# Patient Record
Sex: Female | Born: 1985 | Race: White | Hispanic: No | Marital: Married | Smoking: Former smoker
Health system: Southern US, Community
[De-identification: ages and names within clinical notes are randomized; demographics above are authoritative.]

## PROBLEM LIST (undated history)

## (undated) DIAGNOSIS — R519 Headache, unspecified: Secondary | ICD-10-CM

## (undated) DIAGNOSIS — E271 Primary adrenocortical insufficiency: Secondary | ICD-10-CM

## (undated) HISTORY — DX: Primary adrenocortical insufficiency: E27.1

## (undated) HISTORY — DX: Headache, unspecified: R51.9

---

## 2019-08-28 DIAGNOSIS — G35D Multiple sclerosis, unspecified: Secondary | ICD-10-CM

## 2019-08-28 DIAGNOSIS — E0590 Thyrotoxicosis, unspecified without thyrotoxic crisis or storm: Secondary | ICD-10-CM

## 2019-08-28 DIAGNOSIS — G35 Multiple sclerosis: Secondary | ICD-10-CM

## 2019-08-28 HISTORY — DX: Thyrotoxicosis, unspecified without thyrotoxic crisis or storm: E05.90

## 2019-08-28 HISTORY — DX: Multiple sclerosis: G35

## 2019-08-28 HISTORY — DX: Multiple sclerosis, unspecified: G35.D

## 2020-01-13 NOTE — Progress Notes (Signed)
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  History supplemented by hospital records and referring provider's note.  MRI of brain and cervical/thoracic spine personally reviewed.  She was diagnosed with multiple sclerosis in October 2020 after she presented with right optic neuritis.  She had been experiencing blurred vision in her right eye for about a week.  Work-up was performed at [facility].  MRI of brain with and without contrast showed extensive white matter lesions in a configuration consistent with MS as well as slight enlargement and mild enhancement of the right optic nerve.  MRI of cervical and thoracic spine showed no cord lesions. She underwent lumbar puncture where CSF showed elevated oligoclonal bands of 10 and IgG index 0.96. Systemic lupus profile was unremarkable.  She was treated with Solu-Medrol followed by a prednisone taper at discharge, but never followed up with outpatient neurology.  She has Addison's disease.  Since she was diagnosed with MS, she has also been diagnosed with Grave's disease.  LFTs were slightly elevated (t bili 1.3, AST/ALTs in the mid 100s), attributed to hyperthyroidism  Vision:  Still with slight blurred vision in right eye but improved.   Motor:  Feels generalized weakness.  Difficult to open jars. Sensory:  When she is hot in the sun, she has paresthesias in the arms and legs Pain:  No pain Gait:  No issues Bowel/Bladder:  More frequent bowel movements Fatigue:  Increased fatigue.  Followed up with endocrinology and was diagnosed with Graves Disease Cognition:  Sometimes with word-finding  issues. Mood:  anxiety If she gets up too fast, she feels briefly lightheaded Notes nausea.  Decreased appetite.  Occasional vomiting, possibly related to Graves disease Often feels out of breath. No Lhermitte's sign  Ophthalmologic exam from 01/08/2020, including optic nerve/discs, RNFL, and HVF, was unremarkable.  Imaging: 06/09/2019 MRI BRAIN W WO:  1. Extensive foci of T2 signal prolongation in the periventricular deep white matter and pericallosal fibers, likely representing multiple sclerosis.  2. Slight enlargement of the right optic nerves and mild enhancement, consistent with right optic neuritis, also likely a manifestation of multiple sclerosis.  3. No mass, hemorrhage or acute infarct.  4. Trace chronic anterior ethmoid sinus disease. 06/11/2019 MRI CERVICAL/THORACIC SPINE W WO:  1. No evidence of demyelinating lesion within the cervical or thoracic cord.  2. Mild cervical spondylosis at C5-6 and C6-7.  3. No significant degenerative change of the thoracic spine.  Labs: 06/09/2019 Serum:  RF negative, sed rate 7, ds-DNA antibody 1, anti-Smith negative, SSA/SSB antibodies negative, antichromatic antibodies negative, B12 328, vitamin D 28,  06/10/2019 CSF:  Protein 24, glucose 84, Oligoclonal bands 10 (not in serum), IgG index 0.96, Myelin basic protein 3.3, NMO antibody negative, ACE 1.1, cytology negative, gram stain & culture negative 01/05/2020 Serum:  CBC with WBC 4.6, HGB 13.5, HCT 39.9, PLT 243, ALC 1.40; CMP with Na 135, K 5.3, Cl 102, CO2 26, glucose 83, BUN 14, Cr 0.53, t bili 1.3, ALP 47, AST 157, ALT 158; TSH 0.04, free T4 4.68  No family history of MS.  Current DMT:  None Past DMT:  None  Medications:  Cortef, Florinef  PAST MEDICAL HISTORY: Past Medical History:  Diagnosis Date  . 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 Headache   . Hyperthyroidism 2021  . MS (multiple sclerosis) ([place]) 2021    PAST SURGICAL HISTORY: Past Surgical History:  Procedure  Laterality Date  . CESAREAN SECTION  2021   MEDICATIONS: Outpatient Encounter Medications as of 01/14/2020  Medication Sig  . acetaminophen (TYLENOL) 325 MG tablet Take 325 mg by mouth as needed.  Marland Kitchen atenolol (TENORMIN) 25 MG tablet Take 25 mg by mouth daily.  . fludrocortisone (FLORINEF) 0.1 MG tablet Take 0.1 mg by mouth daily.  . hydrocortisone (CORTEF) 10 MG tablet Take 10 mg by mouth in the morning and at bedtime.  . methimazole (TAPAZOLE) 10 MG tablet Take 10 mg by mouth 3 (three) times daily.   No facility-administered encounter medications on file as of 01/14/2020.    ALLERGIES: Allergies  Allergen Reactions  . Penicillins Rash    FAMILY HISTORY: Family History  Problem Relation Age of Onset  . Alzheimer's disease Paternal Grandfather     SOCIAL HISTORY: Social History   Socioeconomic History  . Marital status: Not on file    Spouse name: Not on file  . Number of children: Not on file  . Years of education: Not on file  . Highest education level: Not on file  Occupational History  . Not on file  Tobacco Use  . Smoking status: Not on file  Substance and Sexual Activity  . Alcohol use: Not on file  . Drug use: Not on file  . Sexual activity: Not on file  Other Topics Concern  . Not on file  Social History Narrative  . Not on file   Social Determinants of Health   Financial Resource Strain:   . Difficulty of Paying Living Expenses:   Food Insecurity:   . Worried About Charity fundraiser in the Last Year:   . Arboriculturist in the Last Year:   Transportation Needs:   . Film/video editor (Medical):   Marland Kitchen Lack of Transportation (Non-Medical):   Physical Activity:   . Days of Exercise per Week:   . Minutes of Exercise per Session:   Stress:   . Feeling of Stress :   Social Connections:   . Frequency of Communication with Friends and Family:   . Frequency of Social Gatherings with Friends and Family:   . Attends Religious Services:   . Active  Member of Clubs or Organizations:   . Attends Archivist Meetings:   Marland Kitchen Marital Status:   Intimate Partner Violence:   . Fear of Current or Ex-Partner:   . Emotionally Abused:   Marland Kitchen Physically Abused:   . Sexually Abused:     PHYSICAL EXAM: Blood pressure 95/65, pulse 89, height 5\' 4"  (1.626 m), weight 119 lb 9.6 oz (54.3 kg), SpO2 99 %. General: No acute distress.  Patient appears well-groomed.   Head:  Normocephalic/atraumatic Eyes:  fundi examined but not visualized Neck: supple, no paraspinal tenderness, full range of motion Back: No paraspinal tenderness Heart: regular rate and rhythm Lungs: Clear to auscultation bilaterally. Vascular: No carotid bruits. Neurological Exam: Mental status: alert and oriented to person, place, and time, recent and remote memory intact, fund of knowledge intact, attention and concentration intact, speech fluent and not dysarthric, language intact. Cranial nerves: CN I: not tested CN II: pupils equal, round and reactive to light, visual fields intact CN III, IV, VI:  full range of motion, no nystagmus, no ptosis CN V:  facial sensation intact CN VII: upper and lower face symmetric CN VIII: hearing intact CN IX, X: gag intact, uvula midline CN XI: sternocleidomastoid and trapezius muscles intact CN XII: tongue midline Bulk & Tone: normal, no fasciculations. Motor:  5/5 throughout  Sensation:  Pinprick and vibration sensation intact.  . Deep Tendon Reflexes:  2+ throughout, toes downgoing. Finger to nose testing:  Without dysmetria.  Heel to shin:  Without dysmetria.  Gait:  Normal station and stride.  Able to turn and tandem walk. Romberg negative.  IMPRESSION: Multiple sclerosis  PLAN: 1.  Plan to initiate Vumerity.  2.  Start D3 4000 IU daily 3.  Repeat new baseline MRI of brain with and without contrast 4.  Repeat CBC with diff, CMP and D in 6 months 5.  Follow up after repeat labs in 6 months  Thank you for allowing me to  take part in the care of this patient.  Shon Millet, DO  CC: Talmage Coin, MD

## 2020-01-14 ENCOUNTER — Other Ambulatory Visit: Payer: Self-pay

## 2020-01-14 ENCOUNTER — Encounter: Payer: Self-pay | Admitting: Neurology

## 2020-01-14 ENCOUNTER — Other Ambulatory Visit: Payer: Self-pay | Admitting: Neurology

## 2020-01-14 ENCOUNTER — Telehealth: Payer: Self-pay

## 2020-01-14 ENCOUNTER — Ambulatory Visit (INDEPENDENT_AMBULATORY_CARE_PROVIDER_SITE_OTHER): Payer: Commercial Managed Care - PPO | Admitting: Neurology

## 2020-01-14 VITALS — BP 95/65 | HR 89 | Ht 64.0 in | Wt 119.6 lb

## 2020-01-14 DIAGNOSIS — G35 Multiple sclerosis: Secondary | ICD-10-CM | POA: Diagnosis not present

## 2020-01-14 MED ORDER — DIAZEPAM 5 MG PO TABS
ORAL_TABLET | ORAL | 0 refills | Status: DC
Start: 2020-01-14 — End: 2020-11-10

## 2020-01-14 NOTE — Telephone Encounter (Signed)
Pt states she recived a call from [facility] about her MRI. She was asked if she has a fear of tight spaces and she answered yes.  Pt states she was told that she has to call Dr. Everlena Cooper to get something sent over to her pharmacy to help her stay calm during the procedure.  Pt wanted to know if that could be sent over to [facility].

## 2020-01-14 NOTE — Patient Instructions (Addendum)
1.  Start D3 4000 IU daily 2.  We will check a new MRI of brain with and without contrast to establish new baseline. We have sent a referral to [facility] for your MRI and they will call you directly to schedule your appointment. They are located at [address] [facility]. If you need to contact them directly please call [phone].  3.  We will start Vumerity (see below) 4.  Repeat CBC with diff, CMP and vitamin D level in 6 months.  5.  Follow up in 6 months (after repeat labs)  Diroximel fumarate delayed-release capsules What is this medicine? DIROXIMEL FUMARATE (dye ROX i mel FUE ma rate) helps to decrease the number of multiple sclerosis relapses in people with relapsing-remitting forms of the disease. It is not a cure. This medicine may be used for other purposes; ask your health care provider or pharmacist if you have questions. COMMON BRAND NAME(S): VUMERITY What should I tell my health care provider before I take this medicine? They need to know if you have any of these conditions:  immune system problems  infection  kidney disease  liver disease  low blood counts, like white cells  an unusual or allergic reaction to diroximel fumarate, dimethyl fumarate, other medicines, foods, dyes, or preservatives  pregnant or trying to get pregnant  breast-feeding How should I use this medicine? Take this medicine by mouth with a glass of water. Follow the directions on the prescription label. Do not cut, crush, or chew this medicine. Swallow the capsules whole. You can take it with or without food. If it upsets your stomach, take it with food. Do not drink alcohol at the time you take a dose of this medicine. Take your medicine at regular intervals. Do not take it more often than directed. Do not stop taking except on your doctor's advice. Talk to your pediatrician regarding the use of this medicine in children. Special care may be needed. Overdosage: If you think you have  taken too much of this medicine contact a poison control center or emergency room at once. NOTE: This medicine is only for you. Do not share this medicine with others. What if I miss a dose? If you miss a dose, take it as soon as you can. If it is almost time for your next dose, take only that dose. Do not take double or extra doses. What may interact with this medicine? Do not take this medicine with the following medications:  dimethyl fumarate  monomethyl fumarate This medicine may also interact with the following medications:  alcohol This list may not describe all possible interactions. Give your health care provider a list of all the medicines, herbs, non-prescription drugs, or dietary supplements you use. Also tell them if you smoke, drink alcohol, or use illegal drugs. Some items may interact with your medicine. What should I watch for while using this medicine? Visit your health care professional for regular checks on your progress. Tell your health care professional if your symptoms do not start to get better or if they get worse. You may need blood work done while you are taking this medicine. Taking your medicine with food (avoid high-fat, high-calorie meal or snack) may help reduce flushing. Call your doctor if the flushing from this medicine bothers you or does not go away. Ask your doctor if taking aspirin before taking this medicine may reduce flushing. This medicine may increase your risk of getting an infection. Call your health care professional for  advice if you get a fever, chills, or sore throat, or other symptoms of a cold or flu. Do not treat yourself. Try to avoid being around people who are sick. In some patients, this medicine may cause a serious brain infection that may cause death. If you have any problems seeing, thinking, speaking, walking, or standing, tell your healthcare professional right away. If you cannot reach your healthcare professional, urgently seek other  source of medical care. What side effects may I notice from receiving this medicine? Side effects that you should report to your doctor or health care professional as soon as possible:  allergic reactions like skin rash, itching or hives, swelling of the face, lips, or tongue  fever, chills, sore throat  signs and symptoms of liver injury like dark yellow or brown urine; general ill feeling or flu-like symptoms; light-colored stools; loss of appetite; nausea; right upper belly pain; unusually weak or tired; yellowing of the eyes or skin  signs and symptoms of a serious brain infection like changes in vision, confusion, weakness on one side of the body, loss of balance or coordination, loss of memory, or changes in personality Side effects that usually do not require medical attention (report to your doctor or health care professional if they continue or are bothersome):  diarrhea  facial flushing  nausea  stomach pain  upset stomach This list may not describe all possible side effects. Call your doctor for medical advice about side effects. You may report side effects to FDA at 1-800-FDA-1088. Where should I keep my medicine? Keep out of the reach of children. Store at room temperature between 20 and 25 degrees C (68 and 77 degrees F). Throw away any unused medicine after the expiration date. NOTE: This sheet is a summary. It may not cover all possible information. If you have questions about this medicine, talk to your doctor, pharmacist, or health care provider.  2020 Elsevier/Gold Standard (2019-03-06 09:81:19)

## 2020-01-14 NOTE — Telephone Encounter (Signed)
LMOVM

## 2020-01-14 NOTE — Telephone Encounter (Signed)
Sent a prescription for diazepam.  She is to take it one hour prior to MRI.  She will need a driver and should not expect to drive for at least 6 hours after taken.

## 2020-02-17 ENCOUNTER — Ambulatory Visit
Admission: RE | Admit: 2020-02-17 | Discharge: 2020-02-17 | Disposition: A | Discharge status: Home / Self Care | Payer: Commercial Managed Care - PPO | Source: Ambulatory Visit | Attending: Neurology | Admitting: Neurology

## 2020-02-17 ENCOUNTER — Other Ambulatory Visit: Payer: Self-pay

## 2020-02-17 DIAGNOSIS — G35 Multiple sclerosis: Secondary | ICD-10-CM

## 2020-02-17 MED ORDER — GADOBENATE DIMEGLUMINE 529 MG/ML IV SOLN
10.0000 mL | Freq: Once | INTRAVENOUS | Status: AC | PRN
  Administered 2020-02-17: 10 mL via INTRAVENOUS

## 2020-03-18 ENCOUNTER — Telehealth: Payer: Self-pay | Admitting: Neurology

## 2020-03-18 DIAGNOSIS — E0590 Thyrotoxicosis, unspecified without thyrotoxic crisis or storm: Secondary | ICD-10-CM | POA: Diagnosis not present

## 2020-03-18 DIAGNOSIS — E271 Primary adrenocortical insufficiency: Secondary | ICD-10-CM | POA: Diagnosis not present

## 2020-03-18 DIAGNOSIS — E0500 Thyrotoxicosis with diffuse goiter without thyrotoxic crisis or storm: Secondary | ICD-10-CM | POA: Diagnosis not present

## 2020-03-18 DIAGNOSIS — L819 Disorder of pigmentation, unspecified: Secondary | ICD-10-CM | POA: Diagnosis not present

## 2020-03-18 NOTE — Telephone Encounter (Signed)
Patient came in the office to check on the medicine Dr. Everlena Cooper was going to have her take at her last visit called Vumerity. She said it was never called into the pharmacy.

## 2020-03-21 NOTE — Telephone Encounter (Signed)
Telephone call to pt, Advised pt start form faxed off to [facility] 01/14/20. Will refax form off today.  Number to [employer] given to pt to check on the status of the form in a week.

## 2020-03-21 NOTE — Progress Notes (Addendum)
Start From refaxed to [employer]. First form faxed 5/50/21.

## 2020-04-01 NOTE — Progress Notes (Signed)
Rosealee Albee (Key: [id number])  Your information has been submitted to [facility]. [employer] [place] will review the request and notify you of the determination decision directly, typically within 72 hours of receiving all information.  You will also receive your request decision electronically. To check for an update later, open this request again from your dashboard.  If [employer] has not responded within the specified timeframe or if you have any questions about your PA submission, contact [employer] [place] directly at [phone].

## 2020-04-05 ENCOUNTER — Encounter: Payer: Self-pay | Admitting: Neurology

## 2020-04-05 NOTE — Progress Notes (Addendum)
Got all the information together for appeal to [facility]. Waiting on patient to come sign form of consent for them so I can fax all info over for appeal.   [facility] 04/05/20 for patient to come sign; left form with front for her to sign and give paperwork back to me.   [facility] 04/07/20- same as above.

## 2020-04-06 DIAGNOSIS — Z20828 Contact with and (suspected) exposure to other viral communicable diseases: Secondary | ICD-10-CM | POA: Diagnosis not present

## 2020-04-06 DIAGNOSIS — J3489 Other specified disorders of nose and nasal sinuses: Secondary | ICD-10-CM | POA: Diagnosis not present

## 2020-04-12 ENCOUNTER — Telehealth: Payer: Self-pay | Admitting: Neurology

## 2020-04-12 NOTE — Telephone Encounter (Signed)
Called patient to let her know she needs to sign consent form with insurance for Vumerity Appeal. She said that she actually wanted to speak with Dr. Everlena Cooper about this. She said that she was thinking she doesn't need to take the medication. She found out that she was suffering from high thyroid levels which was causing her to have more issues. Now that she has her thyroid under control she feels great. She feels as though she doesn't need to go through with Vumerity medication as there is a lot that she can/can't do with that medication. Please follow up with patient and let me know about if I need to continue with appeal. Thanks!

## 2020-04-12 NOTE — Telephone Encounter (Signed)
I strongly recommend taking an MS modifying medication.  Even though she feels okay now, she is at risk for future MS flare ups and progression of disease that may cause disability.  She is young and neurologically, she is doing okay.  I want her to remain this way.  Definitely would like her to follow up to discuss further.

## 2020-04-13 NOTE — Telephone Encounter (Signed)
LMOVM for pt to call us back.

## 2020-04-18 DIAGNOSIS — E271 Primary adrenocortical insufficiency: Secondary | ICD-10-CM | POA: Diagnosis not present

## 2020-04-18 DIAGNOSIS — E0500 Thyrotoxicosis with diffuse goiter without thyrotoxic crisis or storm: Secondary | ICD-10-CM | POA: Diagnosis not present

## 2020-04-18 DIAGNOSIS — E0590 Thyrotoxicosis, unspecified without thyrotoxic crisis or storm: Secondary | ICD-10-CM | POA: Diagnosis not present

## 2020-04-18 DIAGNOSIS — L819 Disorder of pigmentation, unspecified: Secondary | ICD-10-CM | POA: Diagnosis not present

## 2020-04-21 DIAGNOSIS — D224 Melanocytic nevi of scalp and neck: Secondary | ICD-10-CM | POA: Diagnosis not present

## 2020-04-21 DIAGNOSIS — L301 Dyshidrosis [pompholyx]: Secondary | ICD-10-CM | POA: Diagnosis not present

## 2020-04-21 DIAGNOSIS — D225 Melanocytic nevi of trunk: Secondary | ICD-10-CM | POA: Diagnosis not present

## 2020-05-10 DIAGNOSIS — E0590 Thyrotoxicosis, unspecified without thyrotoxic crisis or storm: Secondary | ICD-10-CM | POA: Diagnosis not present

## 2020-05-10 DIAGNOSIS — E040 Nontoxic diffuse goiter: Secondary | ICD-10-CM | POA: Diagnosis not present

## 2020-05-10 DIAGNOSIS — R0989 Other specified symptoms and signs involving the circulatory and respiratory systems: Secondary | ICD-10-CM | POA: Diagnosis not present

## 2020-05-10 DIAGNOSIS — R490 Dysphonia: Secondary | ICD-10-CM | POA: Diagnosis not present

## 2020-05-10 DIAGNOSIS — Z8349 Family history of other endocrine, nutritional and metabolic diseases: Secondary | ICD-10-CM | POA: Diagnosis not present

## 2020-05-12 NOTE — Progress Notes (Deleted)
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  History supplemented by hospital records and referring provider's note.  MRI of brain and cervical/thoracic spine personally reviewed.  UPDATE: Current DMT:  none Other medications:  D3 4000 IU daily  MRI of brain with and without contrast on 02/17/2020 personally reviewed and showed more than 20 non-enhancing T2/FLAIR lesions within the periventricular and juxtacortical white matter bilaterally with no infratentorial lesions, reportedly unchanged compared with prior imaging from Oct 2020.  She reports feeling well and ***  Vision:  Still with slight blurred vision in right eye but improved.   Motor:  Feels generalized weakness.  Difficult to open jars. Sensory:  When she is hot in the sun, she has paresthesias in the arms and legs Pain:  No pain Gait:  No issues Bowel/Bladder:  More frequent bowel movements Fatigue:  Increased fatigue.  Followed up with endocrinology and was diagnosed with Graves Disease Cognition:  Sometimes with word-finding issues. Mood:  anxiety If she gets up too fast, she feels briefly lightheaded Notes nausea.  Decreased appetite.  Occasional vomiting, possibly related to Graves disease Often feels out of breath. No Lhermitte's sign  HISTORY: She was diagnosed with multiple sclerosis in October 2020 after she presented with right optic neuritis.  She had been experiencing blurred vision in her right eye for about a week.  Work-up was performed at [facility].  MRI of brain with and without contrast showed extensive white matter lesions in a configuration consistent with MS as well as slight enlargement and mild enhancement of the right optic nerve.  MRI of cervical and thoracic spine showed no cord  lesions. She underwent lumbar puncture where CSF showed elevated oligoclonal bands of 10 and IgG index 0.96. Systemic lupus profile was unremarkable.  She was treated with Solu-Medrol followed by a prednisone taper at discharge, but never followed up with outpatient neurology.  She has Addison's disease.  Since she was diagnosed with MS, she has also been diagnosed with Grave's disease.  LFTs were slightly elevated (t bili 1.3, AST/ALTs in the mid 100s), attributed to hyperthyroidism   Ophthalmologic exam from 01/08/2020, including optic nerve/discs, RNFL, and HVF, was unremarkable.  Imaging: 06/09/2019 MRI BRAIN W WO:  1. Extensive foci of T2 signal prolongation in the periventricular deep white matter and pericallosal fibers, likely representing multiple sclerosis.  2. Slight enlargement of the right optic nerves and mild enhancement, consistent with right optic neuritis, also likely a manifestation of multiple sclerosis.  3. No mass, hemorrhage or acute infarct.  4. Trace chronic anterior ethmoid sinus disease. 06/11/2019 MRI CERVICAL/THORACIC SPINE W WO:  1. No evidence of demyelinating lesion within the cervical or thoracic cord.  2. Mild cervical spondylosis at C5-6 and C6-7.  3. No significant degenerative change of the thoracic spine.  Labs: 06/09/2019 Serum:  RF negative, sed rate 7, ds-DNA antibody 1, anti-Smith negative, SSA/SSB antibodies negative, antichromatic antibodies negative, B12 328, vitamin D 28,  06/10/2019 CSF:  Protein 24, glucose 84, Oligoclonal bands 10 (not in serum), IgG index 0.96, Myelin basic protein 3.3, NMO antibody negative, ACE 1.1, cytology negative, gram stain & culture negative 01/05/2020 Serum:  CBC with WBC 4.6, HGB 13.5, HCT 39.9, PLT 243, ALC 1.40; CMP with Na 135, K 5.3, Cl 102, CO2 26, glucose 83, BUN 14,  Cr 0.53, t bili 1.3, ALP 47, AST 157, ALT 158; TSH 0.04, free T4 4.68  No family history of MS.  Current DMT:  None Past DMT:   None  Medications:  Cortef, Florinef  PAST MEDICAL HISTORY: Past Medical History:  Diagnosis Date  . Autoimmune Addison's disease (HCC)   . Headache   . Hyperthyroidism 2021  . MS (multiple sclerosis) (HCC) 2021    MEDICATIONS: Current Outpatient Medications on File Prior to Visit  Medication Sig Dispense Refill  . acetaminophen (TYLENOL) 325 MG tablet Take 325 mg by mouth as needed.    Marland Kitchen atenolol (TENORMIN) 25 MG tablet Take 25 mg by mouth daily.    . diazepam (VALIUM) 5 MG tablet Take 1 tablet one hour prior to MRI. 1 tablet 0  . fludrocortisone (FLORINEF) 0.1 MG tablet Take 0.1 mg by mouth daily.    . hydrocortisone (CORTEF) 10 MG tablet Take 10 mg by mouth in the morning and at bedtime.    . methimazole (TAPAZOLE) 10 MG tablet Take 10 mg by mouth 3 (three) times daily.     No current facility-administered medications on file prior to visit.    ALLERGIES: Allergies  Allergen Reactions  . Penicillins Rash    FAMILY HISTORY: Family History  Problem Relation Age of Onset  . Alzheimer's disease Paternal Grandfather     SOCIAL HISTORY: Social History   Socioeconomic History  . Marital status: Married    Spouse name: Not on file  . Number of children: Not on file  . Years of education: Not on file  . Highest education level: Not on file  Occupational History  . Not on file  Tobacco Use  . Smoking status: Former Smoker    Quit date: 06/16/2019    Years since quitting: 0.9  . Smokeless tobacco: Never Used  Vaping Use  . Vaping Use: Never used  Substance and Sexual Activity  . Alcohol use: Not Currently  . Drug use: Not Currently  . Sexual activity: Not on file  Other Topics Concern  . Not on file  Social History Narrative   Right hnaded   Lives with husband and child in one story home   Social Determinants of Health   Financial Resource Strain:   . Difficulty of Paying Living Expenses: Not on file  Food Insecurity:   . Worried About Brewing technologist in the Last Year: Not on file  . Ran Out of Food in the Last Year: Not on file  Transportation Needs:   . Lack of Transportation (Medical): Not on file  . Lack of Transportation (Non-Medical): Not on file  Physical Activity:   . Days of Exercise per Week: Not on file  . Minutes of Exercise per Session: Not on file  Stress:   . Feeling of Stress : Not on file  Social Connections:   . Frequency of Communication with Friends and Family: Not on file  . Frequency of Social Gatherings with Friends and Family: Not on file  . Attends Religious Services: Not on file  . Active Member of Clubs or Organizations: Not on file  . Attends Banker Meetings: Not on file  . Marital Status: Not on file  Intimate Partner Violence:   . Fear of Current or Ex-Partner: Not on file  . Emotionally Abused: Not on file  . Physically Abused: Not on file  . Sexually Abused: Not on file    PHYSICAL EXAM: *** General: No acute distress.  Patient appears well-groomed.   Head:  Normocephalic/atraumatic Eyes:  Fundi examined but not visualized Neck: supple, no paraspinal tenderness, full range of motion Heart:  Regular rate and rhythm Lungs:  Clear to auscultation bilaterally Back: No paraspinal tenderness Neurological Exam: alert and oriented to person, place, and time. Attention span and concentration intact, recent and remote memory intact, fund of knowledge intact.  Speech fluent and not dysarthric, language intact.  CN II-XII intact. Bulk and tone normal, muscle strength 5/5 throughout.  Sensation to pinprick and vibration intact.  Deep tendon reflexes 2+ throughout, toes downgoing.  Finger to nose and heel to shin testing intact.  Gait normal, Romberg negative.  IMPRESSION: Relapsing-remitting multiple sclerosis  PLAN: ***  Shon Millet, DO  CC: ***

## 2020-05-13 ENCOUNTER — Ambulatory Visit: Payer: Commercial Managed Care - PPO | Admitting: Neurology

## 2020-05-16 NOTE — Progress Notes (Signed)
Virtual Visit via Video Note The purpose of this virtual visit is to provide medical care while limiting exposure to the novel coronavirus.    Consent was obtained for video visit:  Yes.   Answered questions that patient had about telehealth interaction:  Yes.   I discussed the limitations, risks, security and privacy concerns of performing an evaluation and management service by telemedicine. I also discussed with the patient that there may be a patient responsible charge related to this service. The patient expressed understanding and agreed to proceed.  Pt location: Home Physician Location: office Name of referring provider:  No ref. provider found I connected with Rosealee Albee at patients initiation/request on 05/18/2020 at 10:30 AM EDT by video enabled telemedicine application and verified that I am speaking with the correct person using two identifiers. 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  UPDATE: Current DMT:  none Other medications:  D3 4000 IU daily   MRI of brain with and without contrast on 02/17/2020 personally reviewed and showed more than 20 non-enhancing T2/FLAIR lesions within the periventricular and juxtacortical white matter bilaterally with no infratentorial lesions, reportedly unchanged compared with prior imaging from Oct 2020.  She was supposed to start Vumerity.  However, since starting treatment for Graves' Disease, she has been feeling well, therefore she decided to hold off on the DMT  Vision:  Very mild blurred vision with distance in the right eye Motor:  Normal Sensory:  No issues. Pain:  No pain Gait:  No issues Bowel/Bladder:  No issues. Fatigue:  no Cognition:  Sometimes with word-finding issues. Mood:  anxiety No Lhermitte's  sign  HISTORY: She was diagnosed with multiple sclerosis in October 2020 after she presented with right optic neuritis.  She had been experiencing blurred vision in her right eye for about a week.  Work-up was performed at [facility].  MRI of brain with and without contrast showed extensive white matter lesions in a configuration consistent with MS as well as slight enlargement and mild enhancement of the right optic nerve.  MRI of cervical and thoracic spine showed no cord lesions. She underwent lumbar puncture where CSF showed elevated oligoclonal bands of 10 and IgG index 0.96. Systemic lupus profile was unremarkable.  She was treated with Solu-Medrol followed by a prednisone taper at discharge, but never followed up with outpatient neurology.  She has Addison's disease.  Since she was diagnosed with MS, she has also been diagnosed with Grave's disease.  LFTs were slightly elevated (t bili 1.3, AST/ALTs in the mid 100s), attributed to hyperthyroidism   Ophthalmologic exam from 01/08/2020, including optic nerve/discs, RNFL, and HVF, was unremarkable.  Imaging: 06/09/2019 MRI BRAIN W WO:  1. Extensive foci of T2 signal prolongation in the periventricular deep white matter and pericallosal fibers, likely representing multiple sclerosis.  2. Slight enlargement of the right optic nerves and mild enhancement, consistent with right optic neuritis, also likely a manifestation of multiple sclerosis.  3. No mass, hemorrhage or acute infarct.  4. Trace chronic anterior ethmoid sinus disease. 06/11/2019 MRI CERVICAL/THORACIC SPINE W WO:  1. No evidence of demyelinating lesion within the cervical or thoracic cord.  2. Mild cervical spondylosis at C5-6 and C6-7.  3. No significant degenerative change of the thoracic spine.  Labs: 06/09/2019 Serum:  RF negative, sed rate 7, ds-DNA antibody 1, anti-Smith negative, SSA/SSB antibodies negative, antichromatic antibodies negative, B12  328, vitamin D 28,  06/10/2019 CSF:  Protein 24, glucose 84, Oligoclonal bands 10 (not in serum), IgG index 0.96, Myelin basic protein 3.3, NMO antibody negative, ACE 1.1, cytology negative, gram stain & culture negative 01/05/2020 Serum:  CBC with WBC 4.6, HGB 13.5, HCT 39.9, PLT 243, ALC 1.40; CMP with Na 135, K 5.3, Cl 102, CO2 26, glucose 83, BUN 14, Cr 0.53, t bili 1.3, ALP 47, AST 157, ALT 158; TSH 0.04, free T4 4.68  No family history of MS.  Past DMT:  None  Medications:  Cortef, Florinef  Past Medical History: Past Medical History:  Diagnosis Date  . Autoimmune Addison's disease (HCC)   . Headache   . Hyperthyroidism 2021  . MS (multiple sclerosis) (HCC) 2021    Medications: Outpatient Encounter Medications as of 05/18/2020  Medication Sig  . acetaminophen (TYLENOL) 325 MG tablet Take 325 mg by mouth as needed.  Marland Kitchen atenolol (TENORMIN) 25 MG tablet Take 25 mg by mouth daily.  . diazepam (VALIUM) 5 MG tablet Take 1 tablet one hour prior to MRI.  . fludrocortisone (FLORINEF) 0.1 MG tablet Take 0.1 mg by mouth daily.  . hydrocortisone (CORTEF) 10 MG tablet Take 10 mg by mouth in the morning and at bedtime.  . methimazole (TAPAZOLE) 10 MG tablet Take 10 mg by mouth 3 (three) times daily.   No facility-administered encounter medications on file as of 05/18/2020.    Allergies: Allergies  Allergen Reactions  . Penicillins Rash    Family History: Family History  Problem Relation Age of Onset  . Alzheimer's disease Paternal Grandfather     Social History: Social History   Socioeconomic History  . Marital status: Married    Spouse name: Not on file  . Number of children: Not on file  . Years of education: Not on file  . Highest education level: Not on file  Occupational History  . Not on file  Tobacco Use  . Smoking status: Former Smoker    Quit date: 06/16/2019    Years since quitting: 0.9  . Smokeless tobacco: Never Used  Vaping Use  . Vaping Use: Never  used  Substance and Sexual Activity  . Alcohol use: Not Currently  . Drug use: Not Currently  . Sexual activity: Not on file  Other Topics Concern  . Not on file  Social History Narrative   Right hnaded   Lives with husband and child in one story home   Social Determinants of Health   Financial Resource Strain:   . Difficulty of Paying Living Expenses: Not on file  Food Insecurity:   . Worried About Programme researcher, broadcasting/film/video in the Last Year: Not on file  . Ran Out of Food in the Last Year: Not on file  Transportation Needs:   . Lack of Transportation (Medical): Not on file  . Lack of Transportation (Non-Medical): Not on file  Physical Activity:   . Days of Exercise per Week: Not on file  . Minutes of Exercise per Session: Not on file  Stress:   . Feeling of Stress : Not on file  Social Connections:   . Frequency of Communication with Friends and Family: Not on file  . Frequency of Social Gatherings with Friends and Family: Not on file  . Attends Religious Services: Not on file  . Active Member of Clubs or Organizations:  Not on file  . Attends Banker Meetings: Not on file  . Marital Status: Not on file  Intimate Partner Violence:   . Fear of Current or Ex-Partner: Not on file  . Emotionally Abused: Not on file  . Physically Abused: Not on file  . Sexually Abused: Not on file    Observations/Objective:   Height 5\' 4"  (1.626 m), weight 130 lb (59 kg). No acute distress.  Alert and oriented.  Speech fluent and not dysarthric.  Language intact.    Assessment and Plan:   Relapsing-remitting multiple sclerosis.  I explained the importance of taking DMT to prevent MS flares and progression of disease.  She is agreeable to starting. Graves Disease  1. Vumerity 2.  D3 4000 IU daily 3.  CBC with diff, CMP and vitamin D level in 6 months 4.  Follow up after repeat labs.  Follow Up Instructions:    -I discussed the assessment and treatment plan with the patient. The  patient was provided an opportunity to ask questions and all were answered. The patient agreed with the plan and demonstrated an understanding of the instructions.   The patient was advised to call back or seek an in-person evaluation if the symptoms worsen or if the condition fails to improve as anticipated.    , DO

## 2020-05-18 ENCOUNTER — Telehealth (INDEPENDENT_AMBULATORY_CARE_PROVIDER_SITE_OTHER): Payer: BC Managed Care – PPO | Admitting: Neurology

## 2020-05-18 ENCOUNTER — Encounter: Payer: Self-pay | Admitting: Neurology

## 2020-05-18 ENCOUNTER — Other Ambulatory Visit: Payer: Self-pay

## 2020-05-18 VITALS — Ht 64.0 in | Wt 130.0 lb

## 2020-05-18 DIAGNOSIS — G35 Multiple sclerosis: Secondary | ICD-10-CM | POA: Diagnosis not present

## 2020-05-18 DIAGNOSIS — E0500 Thyrotoxicosis with diffuse goiter without thyrotoxic crisis or storm: Secondary | ICD-10-CM

## 2020-05-18 NOTE — Patient Instructions (Addendum)
1.  Proceed with Vumerity 2.  Continue D3 4000 IU daily 3.  Repeat CBC with diff, CMP and vitamin D in 6 months 4.  Follow up in 6 months after labs rechecked

## 2020-05-18 NOTE — Addendum Note (Signed)
Addended by: Leida Lauth on: 05/18/2020 11:23 AM   Modules accepted: Orders

## 2020-05-24 ENCOUNTER — Telehealth: Payer: Self-pay | Admitting: Neurology

## 2020-05-24 NOTE — Telephone Encounter (Signed)
Possibly.  Does she want to give the medication a little more time or does she want to change to something else?

## 2020-05-24 NOTE — Telephone Encounter (Signed)
Pt advised that the symptoms could be a side effect. Pt states she will give another couple of weeks and see how she do and give [place] a update.

## 2020-05-24 NOTE — Telephone Encounter (Signed)
Pt waited til the Monday to start her medication around lunch time. Around the mid after noon, Pt noticed her skin was dry. Her whole body feels dry and itchy. No notice of any hives or rash.   Pt wanted to know if this could be a side effect of the medication?

## 2020-05-24 NOTE — Telephone Encounter (Signed)
Patient called with questions about Vumerity that she just started yesterday, 05/23/20. She said, "I'm having some tingling and itching and feel like I'm going to break out in hives."

## 2020-06-08 NOTE — Progress Notes (Addendum)
05/24/20- mailed consent form that ins needs patient to sign and return. Called patient to let her know I was sending.   10/13- called patient and left msg to see if she received consent form and if she was able to return it back to [place] so we could send in appeal.   Patient returned call that she would send it back in the mail today.   10/26- received consent form in mail and faxed to appeals department.   10/29- received appeal denial letter- not medically necessary as denial reason. Gave to [facility] to see what to do next.

## 2020-06-29 ENCOUNTER — Telehealth: Payer: Self-pay | Admitting: Neurology

## 2020-06-29 NOTE — Telephone Encounter (Signed)
Patient called in and spoke with [place]. They are sending her a new quick start kit for Vumerity and they are going to have to appeal again. She wants to make sure we got her signed appeal and see if anything else needs to be done.

## 2020-06-30 NOTE — Telephone Encounter (Signed)
I called patient and told her I was in contact with Crystal at [employer] and just faxed over all her insurance denial letter this morning so she has all that info for the patient assistance.   She said that since insurance was denying it and the price without assistance was 9,000$ a month. She wanted to know if it was worth doing the financial assistance and staying on medication since she has been on it a month already or if she needed to switch to a different medication. She didn't know if for her condition if there were any other medications that were better than this one since she was having to go through assistance to get this medication.   She said to call her back and if she didn't answer it was okay to leave info on her voicemail.

## 2020-06-30 NOTE — Telephone Encounter (Signed)
Can you see on this? thanks

## 2020-06-30 NOTE — Telephone Encounter (Signed)
Called patient to give her update. She will stay on the medication and continue with assistance program.

## 2020-06-30 NOTE — Telephone Encounter (Signed)
fyi

## 2020-06-30 NOTE — Telephone Encounter (Signed)
Since she has already started Vumerity, I would continue it and use the assistance program.  It is a good medication.

## 2020-07-07 ENCOUNTER — Telehealth: Payer: Self-pay | Admitting: Neurology

## 2020-07-07 NOTE — Telephone Encounter (Signed)
We can change to a different medication.  I would consider Ponvory, Kesimpta or Zeposia

## 2020-07-07 NOTE — Telephone Encounter (Signed)
Pt states she thinks the itching and burning and red spots may come from the Vumerity. Pt stats she the medication at the same time. Besides in September this is the only time it happened.  Please advise

## 2020-07-07 NOTE — Telephone Encounter (Signed)
Patient called and said, "Yesterday evening I had an episode of what looked like hives. It started on my arms and they were tingling and itching. This then very quickly spread to my neck, face with red patches, then to my whole body. I was on the phone with a nurse educator but was told they could not give my any advice on what to take for it. I took some benadryl and that cleared it up. I'd like to speak with Dr. Everlena Cooper or his nurse."  [facility] in [place], [place]

## 2020-07-08 NOTE — Telephone Encounter (Signed)
Telephone call to the pt, Pt states every time she takes the Vumerity her second dose she has side effects of Rash and itching burning.    Pt states she has stop taking the medication as of last night. What else could she take for her MS?

## 2020-07-08 NOTE — Telephone Encounter (Signed)
Pt advised of the different medication recommended by DR. Jaffe.    Pt will research and I will put pt information in the mail for her as well. Pt to call back on Monday to let us know which one she will like to start.

## 2020-07-18 ENCOUNTER — Ambulatory Visit: Payer: Commercial Managed Care - PPO | Admitting: Neurology

## 2020-08-05 DIAGNOSIS — E271 Primary adrenocortical insufficiency: Secondary | ICD-10-CM | POA: Diagnosis not present

## 2020-08-05 DIAGNOSIS — L819 Disorder of pigmentation, unspecified: Secondary | ICD-10-CM | POA: Diagnosis not present

## 2020-08-05 DIAGNOSIS — E0590 Thyrotoxicosis, unspecified without thyrotoxic crisis or storm: Secondary | ICD-10-CM | POA: Diagnosis not present

## 2020-08-05 DIAGNOSIS — E0500 Thyrotoxicosis with diffuse goiter without thyrotoxic crisis or storm: Secondary | ICD-10-CM | POA: Diagnosis not present

## 2020-09-07 DIAGNOSIS — E0590 Thyrotoxicosis, unspecified without thyrotoxic crisis or storm: Secondary | ICD-10-CM | POA: Diagnosis not present

## 2020-09-22 DIAGNOSIS — E271 Primary adrenocortical insufficiency: Secondary | ICD-10-CM | POA: Diagnosis not present

## 2020-09-22 DIAGNOSIS — Z20822 Contact with and (suspected) exposure to covid-19: Secondary | ICD-10-CM | POA: Diagnosis not present

## 2020-09-22 DIAGNOSIS — J029 Acute pharyngitis, unspecified: Secondary | ICD-10-CM | POA: Diagnosis not present

## 2020-09-22 DIAGNOSIS — Z7189 Other specified counseling: Secondary | ICD-10-CM | POA: Diagnosis not present

## 2020-10-05 DIAGNOSIS — G35 Multiple sclerosis: Secondary | ICD-10-CM | POA: Diagnosis not present

## 2020-10-05 DIAGNOSIS — E271 Primary adrenocortical insufficiency: Secondary | ICD-10-CM | POA: Diagnosis not present

## 2020-10-05 DIAGNOSIS — M79671 Pain in right foot: Secondary | ICD-10-CM | POA: Diagnosis not present

## 2020-10-05 DIAGNOSIS — S99921A Unspecified injury of right foot, initial encounter: Secondary | ICD-10-CM | POA: Diagnosis not present

## 2020-10-05 DIAGNOSIS — Z87891 Personal history of nicotine dependence: Secondary | ICD-10-CM | POA: Diagnosis not present

## 2020-10-05 DIAGNOSIS — W228XXA Striking against or struck by other objects, initial encounter: Secondary | ICD-10-CM | POA: Diagnosis not present

## 2020-10-17 DIAGNOSIS — E0500 Thyrotoxicosis with diffuse goiter without thyrotoxic crisis or storm: Secondary | ICD-10-CM | POA: Diagnosis not present

## 2020-10-17 DIAGNOSIS — E0590 Thyrotoxicosis, unspecified without thyrotoxic crisis or storm: Secondary | ICD-10-CM | POA: Diagnosis not present

## 2020-10-17 DIAGNOSIS — E271 Primary adrenocortical insufficiency: Secondary | ICD-10-CM | POA: Diagnosis not present

## 2020-11-09 DIAGNOSIS — H469 Unspecified optic neuritis: Secondary | ICD-10-CM | POA: Diagnosis not present

## 2020-11-10 ENCOUNTER — Other Ambulatory Visit: Payer: Self-pay

## 2020-11-10 ENCOUNTER — Ambulatory Visit: Payer: BC Managed Care – PPO | Admitting: Neurology

## 2020-11-10 ENCOUNTER — Other Ambulatory Visit (INDEPENDENT_AMBULATORY_CARE_PROVIDER_SITE_OTHER): Payer: BC Managed Care – PPO

## 2020-11-10 ENCOUNTER — Encounter: Payer: Self-pay | Admitting: Neurology

## 2020-11-10 VITALS — BP 124/88 | HR 75 | Ht 64.0 in | Wt 125.8 lb

## 2020-11-10 DIAGNOSIS — G35 Multiple sclerosis: Secondary | ICD-10-CM

## 2020-11-10 DIAGNOSIS — Z8669 Personal history of other diseases of the nervous system and sense organs: Secondary | ICD-10-CM

## 2020-11-10 DIAGNOSIS — Z79899 Other long term (current) drug therapy: Secondary | ICD-10-CM | POA: Diagnosis not present

## 2020-11-10 LAB — CBC WITH DIFFERENTIAL/PLATELET
Basophils Absolute: 0 10*3/uL (ref 0.0–0.1)
Basophils Relative: 0.4 % (ref 0.0–3.0)
Eosinophils Absolute: 0.2 10*3/uL (ref 0.0–0.7)
Eosinophils Relative: 2.9 % (ref 0.0–5.0)
HCT: 41.9 % (ref 36.0–46.0)
Hemoglobin: 14.3 g/dL (ref 12.0–15.0)
Lymphocytes Relative: 46.9 % — ABNORMAL HIGH (ref 12.0–46.0)
Lymphs Abs: 3.5 10*3/uL (ref 0.7–4.0)
MCHC: 34.1 g/dL (ref 30.0–36.0)
MCV: 86.5 fl (ref 78.0–100.0)
Monocytes Absolute: 0.6 10*3/uL (ref 0.1–1.0)
Monocytes Relative: 7.4 % (ref 3.0–12.0)
Neutro Abs: 3.2 10*3/uL (ref 1.4–7.7)
Neutrophils Relative %: 42.4 % — ABNORMAL LOW (ref 43.0–77.0)
Platelets: 207 10*3/uL (ref 150.0–400.0)
RBC: 4.85 Mil/uL (ref 3.87–5.11)
RDW: 12.7 % (ref 11.5–15.5)
WBC: 7.5 10*3/uL (ref 4.0–10.5)

## 2020-11-10 LAB — VITAMIN D 25 HYDROXY (VIT D DEFICIENCY, FRACTURES): VITD: 24.76 ng/mL — ABNORMAL LOW (ref 30.00–100.00)

## 2020-11-10 NOTE — Patient Instructions (Addendum)
1.  Plan to start Zeposia: -  Will need CBC with diff, hepatic panel, EKG.  Then repeat CBC with diff, hepatic panel and vit D in 6 months. 2.  Also check vitamin D level 3.  Follow up with Dr. Genia Del as soon as possible to evaluate for active optic neuritis - if there is, then I would give you IV steroids 4.  Check MRI of brain with and without contrast.We have sent a referral to [facility] for your MRI and they will call you directly to schedule your appointment. They are located at [address] [facility]. If you need to contact them directly please call [phone].   5.  Follow up 6 months.

## 2020-11-10 NOTE — Progress Notes (Signed)
NEUROLOGY FOLLOW UP OFFICE NOTE  Mary Collins [id number]  Assessment/Plan:   1.  Left eye pain - reportedly had "pressure", suspect intraocular pressure.  As she reportedly had healthy optic nerve, I don't suspect optic neuritis but I would like further ophthalmologic exam to clarify findings.  If eye exam is unremarkable, consider migraine. 2.  Multiple sclerosis  1.  Plan to start DMT:  I have chosen to start Zeposia - will check CBC w/diff, hepatic panel and EKG. 2.  Will check vitamin D panel 3.  Check MRI of brain with and without contrast to evaluate for any acute MS and to establish a new baseline prior to starting DMT. 4.  Advised to follow up with her ophthalmologist here in [place], Dr. Genia Del, for further eye exam - if findings are consistent with active optic neuritis, then would start Solu-Medrol.  If no cause of symptoms are found, consider starting a migraine preventative.   5.  Will obtain note from her optometrist in [place]. 6.  Follow up in 6 months.  Subjective:  Mary Collins is a 34 year oldright-handedCaucasianfemale with Addison's disease, Graves' diseaseand anxiety who follows up for multiple sclerosis and new eye pressure.  UPDATE: Current DMT:  none Other medications:  None.  She stopped taking D3 4000 IU daily   She had started Vumerity but stopped because it caused itching and burning macular rash.  She was advised in November to consider alternative medication such as Ponvory, Kesimpta or Zeposia.  She was unable to decide.  Vision:For 3 days, she feels like she has a "headache in my eyeballs".  Mostly in left eye.  Yesterday it was so severe, she had nausea and photophobia.  No ocular pain or decreased visual acuity.  Eye looked red.  She saw the optometrist and was told that her eye pressure was elevated and unsure if related to her thyroid or MS.  Notes aren't available to me but patient said she was told that her optic nerve looked okay.   She says since she was off of Vumerity, her eyes are puffy all of the time.   Motor: Normal Sensory: No issues. Pain: No pain Gait: No issues Bowel/Bladder: No issues. Fatigue: no Cognition: Sometimes with word-finding issues. Mood: anxiety No Lhermitte's sign  HISTORY: She was diagnosed with multiple sclerosis in October 2020 after she presented with right optic neuritis. She had been experiencing blurred vision in her right eyefor about a week. Work-up was performed at [facility]. MRI of brain with and without contrast showed extensive white matter lesions in a configuration consistent with MS as well as slight enlargement and mild enhancement of the right optic nerve. MRI of cervical and thoracic spine showed no cord lesions. She underwent lumbar puncture where CSF showed elevated oligoclonal bands of 10 and IgG index 0.96. Systemic lupus profile was unremarkable. She was treated with Solu-Medrol followed by a prednisone taper at discharge, but never followed up with outpatient neurology.  She has Addison's disease. Since she was diagnosed with MS, she has also been diagnosed with Grave's disease. LFTs were slightly elevated (t bili 1.3, AST/ALTs in the mid 100s), attributed to hyperthyroidism  Ophthalmologic exam from 01/08/2020, including optic nerve/discs, RNFL, and HVF, was unremarkable.  Imaging: 06/09/2019 MRI BRAIN W WO:1. Extensive foci of T2 signal prolongation in the periventricular deep white matter and pericallosal fibers, likely representing multiple sclerosis. 2. Slight enlargement of the right optic nerves and mild enhancement, consistent with right optic  neuritis, also likely a manifestation of multiple sclerosis. 3. No mass, hemorrhage or acute infarct. 4. Trace chronic anterior ethmoid sinus disease. 06/11/2019 MRI CERVICAL/THORACIC SPINE W WO:1. No evidence of demyelinating lesion within the cervical or thoracic cord.  2. Mild cervical spondylosis at C5-6 and C6-7. 3. No significant degenerative change of the thoracic spine.   02/17/2020 MRI BRAIN W WO:  more than 20 non-enhancing T2/FLAIR lesions within the periventricular and juxtacortical white matter bilaterally with no infratentorial lesions, reportedly unchanged compared with prior imaging from Oct 2020.  Labs: 06/09/2019 Serum: RF negative, sed rate 7, ds-DNA antibody 1, anti-Smith negative, SSA/SSB antibodies negative, antichromatic antibodies negative, B12 328, vitamin D 28,  06/10/2019 CSF: Protein 24, glucose 84, Oligoclonal bands 10 (not in serum), IgG index 0.96, Myelin basic protein 3.3, NMO antibody negative, ACE 1.1, cytology negative, gram stain &culture negative 01/05/2020 Serum: CBC with WBC 4.6, HGB 13.5, HCT 39.9, PLT 243, ALC 1.40; CMP with Na 135, K 5.3, Cl 102, CO2 26, glucose 83, BUN 14, Cr 0.53, t bili 1.3, ALP 47, AST 157, ALT 158; TSH 0.04, free T4 4.68  No family history of MS.  Past DMT:  Vumerity (hives)  Medications: Cortef, Florinef  PAST MEDICAL HISTORY: Past Medical History:  Diagnosis Date  . Autoimmune Addison's disease (HCC)   . Headache   . Hyperthyroidism 2021  . MS (multiple sclerosis) (HCC) 2021    MEDICATIONS: Current Outpatient Medications on File Prior to Visit  Medication Sig Dispense Refill  . acetaminophen (TYLENOL) 325 MG tablet Take 325 mg by mouth as needed.    Marland Kitchen atenolol (TENORMIN) 25 MG tablet Take 25 mg by mouth daily.    . diazepam (VALIUM) 5 MG tablet Take 1 tablet one hour prior to MRI. 1 tablet 0  . fludrocortisone (FLORINEF) 0.1 MG tablet Take 0.1 mg by mouth daily.    . hydrocortisone (CORTEF) 10 MG tablet Take 10 mg by mouth in the morning and at bedtime.    . methimazole (TAPAZOLE) 10 MG tablet Take 10 mg by mouth 3 (three) times daily.     No current facility-administered medications on file prior to visit.    ALLERGIES: Allergies  Allergen Reactions  . Penicillins Rash     FAMILY HISTORY: Family History  Problem Relation Age of Onset  . Alzheimer's disease Paternal Grandfather       Objective:  Blood pressure 124/88, pulse 75, height 5\' 4"  (1.626 m), weight 125 lb 12.8 oz (57.1 kg), SpO2 99 %. General: No acute distress.  Patient appears well-groomed.   Head:  Normocephalic/atraumatic Eyes:  Fundi examined but not visualized Neck: supple, no paraspinal tenderness, full range of motion Heart:  Regular rate and rhythm Lungs:  Clear to auscultation bilaterally Back: No paraspinal tenderness Neurological Exam: alert and oriented to person, place, and time. Attention span and concentration intact, recent and remote memory intact, fund of knowledge intact.  Speech fluent and not dysarthric, language intact.  CN II-XII intact. Bulk and tone normal, muscle strength 5/5 throughout.  Sensation to light touch, temperature and vibration intact.  Deep tendon reflexes 2+ throughout, toes downgoing.  Finger to nose and heel to shin testing intact.  Gait normal, Romberg negative.     , DO

## 2020-11-10 NOTE — Addendum Note (Signed)
Addended by: Leida Lauth on: 11/10/2020 11:47 AM   Modules accepted: Orders

## 2020-11-11 LAB — HEPATIC FUNCTION PANEL
ALT: 17 U/L (ref 0–35)
AST: 16 U/L (ref 0–37)
Albumin: 4.1 g/dL (ref 3.5–5.2)
Alkaline Phosphatase: 54 U/L (ref 39–117)
Bilirubin, Direct: 0.2 mg/dL (ref 0.0–0.3)
Total Bilirubin: 1.4 mg/dL — ABNORMAL HIGH (ref 0.2–1.2)
Total Protein: 6.7 g/dL (ref 6.0–8.3)

## 2020-11-13 LAB — ACUTE HEP PANEL AND HEP B SURFACE AB
HEPATITIS C ANTIBODY REFILL$(REFL): NONREACTIVE
Hep A IgM: NONREACTIVE
Hep B C IgM: NONREACTIVE
Hepatitis B Surface Ag: NONREACTIVE
SIGNAL TO CUT-OFF: 0 (ref <1.00)

## 2020-11-13 LAB — REFLEX TIQ

## 2020-11-14 ENCOUNTER — Encounter: Payer: Self-pay | Admitting: Neurology

## 2020-11-14 ENCOUNTER — Telehealth: Payer: Self-pay

## 2020-11-14 NOTE — Telephone Encounter (Signed)
-----   Message from Drema Dallas, DO sent at 11/11/2020  4:25 PM EDT ----- Labs reviewed and are acceptable.  Just waiting on the EKG before we can start Zeposia.  The total bilirubin is borderline elevated, which is not significant.  However, I would like to recheck hepatic panel 3 months after starting Zeposia.  Vitamin D level is low so I would recommend restarting D3 at 4000 IU daily.

## 2020-11-14 NOTE — Telephone Encounter (Signed)
Patient states that we need to call Dr Genia Del office at [facility]. They need to know why Dr Everlena Cooper wanted them to see her please call   Dr Genia Del office phone number is [phone]

## 2020-11-14 NOTE — Telephone Encounter (Signed)
Tried calling [facility] back in regrads to the pt needing to be seen.PEr Dr.Jaffe last ov notes:  Advised to follow up with her ophthalmologist here in [place], Dr. Genia Del, for further eye exam - if findings are consistent with active optic neuritis, then would start Solu-Medrol.  If no cause of symptoms are found, consider starting a migraine preventative.     Triage nurse returned our call, Advised her of Dr.JAffe note. Transferred to scheduler to schedule the pt, no answer. LMOVM to give [place] a call back or the pt.

## 2020-11-14 NOTE — Progress Notes (Addendum)
Zeposia sart form filled and faxed off.   PA Done and approved.  EKG to be done by the company, Per pt she do not have a pcp to go to, to have it done.

## 2020-11-14 NOTE — Telephone Encounter (Signed)
Pt advised of her lab results. recommendation to start Vitamin d3. Pt states she was contacted by the company for Zeposia.  EKG will be done by them next Sunday.   Once EKG is reviewed we will call the company and the pt approve medication.

## 2020-11-14 NOTE — Progress Notes (Signed)
Received fax approval valid from 11/11/20 to 11/10/21 for the Zepsoia starter pack and .92mg  capsules. Both approvals under the REF #: .

## 2020-11-22 ENCOUNTER — Encounter: Payer: Self-pay | Admitting: Neurology

## 2020-11-23 ENCOUNTER — Ambulatory Visit: Payer: Commercial Managed Care - PPO | Admitting: Neurology

## 2020-12-03 ENCOUNTER — Other Ambulatory Visit: Payer: BC Managed Care – PPO

## 2020-12-05 DIAGNOSIS — Z79899 Other long term (current) drug therapy: Secondary | ICD-10-CM | POA: Diagnosis not present

## 2020-12-05 DIAGNOSIS — Z20822 Contact with and (suspected) exposure to covid-19: Secondary | ICD-10-CM | POA: Diagnosis not present

## 2020-12-05 DIAGNOSIS — G35 Multiple sclerosis: Secondary | ICD-10-CM | POA: Diagnosis not present

## 2020-12-05 DIAGNOSIS — H469 Unspecified optic neuritis: Secondary | ICD-10-CM | POA: Diagnosis not present

## 2020-12-06 ENCOUNTER — Telehealth: Payer: Self-pay | Admitting: Neurology

## 2020-12-06 DIAGNOSIS — H469 Unspecified optic neuritis: Secondary | ICD-10-CM | POA: Diagnosis not present

## 2020-12-06 NOTE — Telephone Encounter (Signed)
Patient called and said she is still having problems with her eyes. She's not sure what to do. She said she's seen endocrinologist, eye doctor, and her family doctor with still no answers.  Patient wants a call back from the nurse.

## 2020-12-06 NOTE — Telephone Encounter (Signed)
Tried calling pt no answer. LMoVM, To call the office back.

## 2020-12-06 NOTE — Telephone Encounter (Signed)
MRI of brain with and without contrast was ordered and hopefully has been scheduled.  If Dr. Genia Del didn't see any concerning abnormalities on eye exam, then I would like to treat for possible migraine (which may present with visual symptoms).  I would like to start nortriptyline 10mg  at bedtime for one week, then increase to 20mg  at bedtime.  We can increase dose in 5 weeks if needed.

## 2020-12-07 NOTE — Telephone Encounter (Signed)
Per Pt her MRI is scheduled for 12/25/20 at 1:40 pm.  Pt advised that dr.Jaffe wants  to start Notriptyline 10 mg and increase to 20 mg a week later.   Per pt her ocular pressure over 26, but once she saw Dr. Genia Del exam on 12/07/20 her pressure was okay.  No stress in th eyes. Only allergies. And that everything that is going on over the last five days is not related to MS, But her pcp states it could be. Pt states she do not want to take anything that is not needed.   She do not have the headache anymore.    Please advised    Advised pt that we have Zeposia send a nurse out to do extra labs. Once reviewed pt may start medication.

## 2020-12-07 NOTE — Telephone Encounter (Signed)
Patient returned call to Sheena. 

## 2020-12-08 NOTE — Telephone Encounter (Signed)
If there was no evidence of active optic neuritis on exam, then no further recommendation as it is not an MS flare.  If headaches are resolved, she may hold off on starting nortriptyline.

## 2020-12-08 NOTE — Telephone Encounter (Signed)
Pt advised of Dr.Jaffe note. 

## 2020-12-12 ENCOUNTER — Telehealth: Payer: Self-pay | Admitting: Neurology

## 2020-12-12 MED ORDER — NORTRIPTYLINE HCL 10 MG PO CAPS: ORAL_CAPSULE | ORAL | 5 refills | Status: DC

## 2020-12-12 NOTE — Telephone Encounter (Signed)
Per Dr.Jaffe  Notriptyline 10 mg with 5 refills sent to the pharmacy. Pt to increase to 20 mg after a week.

## 2020-12-12 NOTE — Telephone Encounter (Signed)
Patient is ready to start the migraine medication she is not sure of the name of the medication. She states [facility] [place]

## 2020-12-13 ENCOUNTER — Telehealth: Payer: Self-pay | Admitting: Neurology

## 2020-12-13 NOTE — Telephone Encounter (Signed)
Pt will google  And talk to her family and friends to see what she is on and call us back.

## 2020-12-13 NOTE — Telephone Encounter (Signed)
Patient called in after filling the Nortriptyline. She saw that it was an antidepressant. She does not want to be on any kind of antidepressants.

## 2020-12-20 ENCOUNTER — Other Ambulatory Visit: Payer: Self-pay | Admitting: Neurology

## 2020-12-20 ENCOUNTER — Telehealth: Payer: Self-pay | Admitting: Neurology

## 2020-12-20 MED ORDER — DIAZEPAM 5 MG PO TABS: ORAL_TABLET | ORAL | 0 refills | Status: DC

## 2020-12-20 NOTE — Telephone Encounter (Signed)
[facility] [place].  Will need a driver to and from the MRI

## 2020-12-20 NOTE — Telephone Encounter (Signed)
Patient called and said she has an MRI on 12/25/20 and she is claustrophobic and needs some medication to help.  [facility] [place] [facility] [address]

## 2020-12-20 NOTE — Telephone Encounter (Signed)
LMOVM

## 2020-12-22 DIAGNOSIS — E0500 Thyrotoxicosis with diffuse goiter without thyrotoxic crisis or storm: Secondary | ICD-10-CM | POA: Diagnosis not present

## 2020-12-22 DIAGNOSIS — E271 Primary adrenocortical insufficiency: Secondary | ICD-10-CM | POA: Diagnosis not present

## 2020-12-22 DIAGNOSIS — E049 Nontoxic goiter, unspecified: Secondary | ICD-10-CM | POA: Diagnosis not present

## 2020-12-22 DIAGNOSIS — E0590 Thyrotoxicosis, unspecified without thyrotoxic crisis or storm: Secondary | ICD-10-CM | POA: Diagnosis not present

## 2020-12-25 ENCOUNTER — Other Ambulatory Visit: Payer: BC Managed Care – PPO

## 2020-12-25 ENCOUNTER — Ambulatory Visit
Admission: RE | Admit: 2020-12-25 | Discharge: 2020-12-25 | Disposition: A | Discharge status: Home / Self Care | Payer: BC Managed Care – PPO | Source: Ambulatory Visit | Attending: Neurology | Admitting: Neurology

## 2020-12-25 DIAGNOSIS — G379 Demyelinating disease of central nervous system, unspecified: Secondary | ICD-10-CM | POA: Diagnosis not present

## 2020-12-25 DIAGNOSIS — G35 Multiple sclerosis: Secondary | ICD-10-CM | POA: Diagnosis not present

## 2020-12-25 MED ORDER — GADOBENATE DIMEGLUMINE 529 MG/ML IV SOLN
12.0000 mL | Freq: Once | INTRAVENOUS | Status: AC | PRN
  Administered 2020-12-25: 12 mL via INTRAVENOUS

## 2021-01-04 ENCOUNTER — Other Ambulatory Visit: Payer: Self-pay | Admitting: Neurology

## 2021-01-09 DIAGNOSIS — D235 Other benign neoplasm of skin of trunk: Secondary | ICD-10-CM | POA: Diagnosis not present

## 2021-01-09 DIAGNOSIS — D485 Neoplasm of uncertain behavior of skin: Secondary | ICD-10-CM | POA: Diagnosis not present

## 2021-01-09 DIAGNOSIS — D225 Melanocytic nevi of trunk: Secondary | ICD-10-CM | POA: Diagnosis not present

## 2021-01-09 DIAGNOSIS — L578 Other skin changes due to chronic exposure to nonionizing radiation: Secondary | ICD-10-CM | POA: Diagnosis not present

## 2021-01-16 DIAGNOSIS — J029 Acute pharyngitis, unspecified: Secondary | ICD-10-CM | POA: Diagnosis not present

## 2021-01-16 DIAGNOSIS — Z20828 Contact with and (suspected) exposure to other viral communicable diseases: Secondary | ICD-10-CM | POA: Diagnosis not present

## 2021-01-16 DIAGNOSIS — J0100 Acute maxillary sinusitis, unspecified: Secondary | ICD-10-CM | POA: Diagnosis not present

## 2021-01-16 DIAGNOSIS — J209 Acute bronchitis, unspecified: Secondary | ICD-10-CM | POA: Diagnosis not present

## 2021-02-03 DIAGNOSIS — E0590 Thyrotoxicosis, unspecified without thyrotoxic crisis or storm: Secondary | ICD-10-CM | POA: Diagnosis not present

## 2021-02-20 DIAGNOSIS — D225 Melanocytic nevi of trunk: Secondary | ICD-10-CM | POA: Diagnosis not present

## 2021-02-20 DIAGNOSIS — D2362 Other benign neoplasm of skin of left upper limb, including shoulder: Secondary | ICD-10-CM | POA: Diagnosis not present

## 2021-02-20 DIAGNOSIS — L578 Other skin changes due to chronic exposure to nonionizing radiation: Secondary | ICD-10-CM | POA: Diagnosis not present

## 2021-05-16 NOTE — Progress Notes (Signed)
NEUROLOGY FOLLOW UP OFFICE NOTE  Mary Collins [id number]  Assessment/Plan:   Multiple sclerosis, relapsing-remitting  DMT:  Hassie Bruce D3 4000 IU daily Check labs CBC w/diff, LFTs, quantitative immunoglobulin panel and Vit D today and again in 6 months Repeat MRI of brain with and without contrast in 6 months Follow up 6 months (after repeat tests)  Subjective:  Mary Collins is a 35 year old right-handed Caucasian female with Addison's disease, Graves' disease and anxiety who follows up for multiple sclerosis and new eye pressure.   UPDATE: Current DMT:  Zeposia Other medications:  D3 4000 IU daily  11/10/2020 LABS:  CBC with WBC 7.5, HGB 14.3, HCT 41.9, PLT 207, ALC 3.5; hepatic function with t bili 1.4, ALP 54, AST 16, ALT 17; negative hepatitis panel/Hep B surface ab; Vit D 24.76 - she was advised to start D3 4000 IU daily.  In March, she endorsed eye pain with decreased visual acuity.  She saw her ophthalmologist who didn't find any concerning abnormalities.  Due to side effects, Vumerity was stopped and she was to start Zeposia.  MRI of brain with and without contrast on 12/25/2020 personally reviewed showed new punctate enhancing lesion in the periventricular white matter adjacent to the left latera ventricle.      Vision:  No issues Motor:  Normal Sensory:  No issues. Pain:  No pain Gait:  No issues Bowel/Bladder:  No issues. Fatigue:  No Cognition:  Sometimes with word-finding issues. Mood:  anxiety No Lhermitte's sign   HISTORY: She was diagnosed with multiple sclerosis in October 2020 after she presented with right optic neuritis.  She had been experiencing blurred vision in her right eye for about a week.  Work-up was performed at [facility].  MRI of brain with and without contrast showed extensive white matter lesions in a configuration consistent with MS as well as slight enlargement and mild enhancement of the right optic nerve.  MRI of  cervical and thoracic spine showed no cord lesions. She underwent lumbar puncture where CSF showed elevated oligoclonal bands of 10 and IgG index 0.96. Systemic lupus profile was unremarkable.  She was treated with Solu-Medrol followed by a prednisone taper at discharge, but never followed up with outpatient neurology.   She has Addison's disease.  Since she was diagnosed with MS, she has also been diagnosed with Grave's disease.  LFTs were slightly elevated (t bili 1.3, AST/ALTs in the mid 100s), attributed to hyperthyroidism   Ophthalmologic exam from 01/08/2020, including optic nerve/discs, RNFL, and HVF, was unremarkable.   Imaging: 06/09/2019 MRI BRAIN W WO:  1.  Extensive foci of T2 signal prolongation in the periventricular deep white matter and pericallosal fibers, likely representing multiple sclerosis.  2.  Slight enlargement of the right optic nerves and mild enhancement, consistent with right optic neuritis, also likely a manifestation of multiple sclerosis.  3.  No mass, hemorrhage or acute infarct.  4.  Trace chronic anterior ethmoid sinus disease. 06/11/2019 MRI CERVICAL/THORACIC SPINE W WO:  1. No evidence of demyelinating lesion within the cervical or thoracic cord.  2. Mild cervical spondylosis at C5-6 and C6-7.  3. No significant degenerative change of the thoracic spine.   02/17/2020 MRI BRAIN W WO:  more than 20 non-enhancing T2/FLAIR lesions within the periventricular and juxtacortical white matter bilaterally with no infratentorial lesions, reportedly unchanged compared with prior imaging from Oct 2020.   Labs: 06/09/2019 Serum:  RF negative, sed rate 7, ds-DNA antibody 1, anti-Smith negative,  SSA/SSB antibodies negative, antichromatic antibodies negative, B12 328, vitamin D 28,  06/10/2019 CSF:  Protein 24, glucose 84, Oligoclonal bands 10 (not in serum), IgG index 0.96, Myelin basic protein 3.3, NMO antibody negative, ACE 1.1, cytology negative, gram stain & culture  negative 01/05/2020 Serum:  CBC with WBC 4.6, HGB 13.5, HCT 39.9, PLT 243, ALC 1.40; CMP with Na 135, K 5.3, Cl 102, CO2 26, glucose 83, BUN 14, Cr 0.53, t bili 1.3, ALP 47, AST 157, ALT 158; TSH 0.04, free T4 4.68   No family history of MS.  Past DMT:  Vumerity (hives)   Medications:  Cortef, Florinef  PAST MEDICAL HISTORY: Past Medical History:  Diagnosis Date   Autoimmune Addison's disease (HCC)    Headache    Hyperthyroidism 2021   MS (multiple sclerosis) (HCC) 2021    MEDICATIONS: Current Outpatient Medications on File Prior to Visit  Medication Sig Dispense Refill   acetaminophen (TYLENOL) 325 MG tablet Take 325 mg by mouth as needed.     atenolol (TENORMIN) 25 MG tablet Take 25 mg by mouth daily. (Patient not taking: Reported on 11/10/2020)     diazepam (VALIUM) 5 MG tablet Take 1 tablet 30 to 40 minutes prior to MRI. 1 tablet 0   fludrocortisone (FLORINEF) 0.1 MG tablet Take 0.1 mg by mouth daily.     hydrocortisone (CORTEF) 10 MG tablet Take 10 mg by mouth in the morning and at bedtime.     methimazole (TAPAZOLE) 10 MG tablet Take 10 mg by mouth 3 (three) times daily.     nortriptyline (PAMELOR) 10 MG capsule TAKE 10 MG FOR ONE WEEK THEN INCREASE TO 20 MG 180 capsule 1   propylthiouracil (PTU) 50 MG tablet Take 100 mg by mouth 3 (three) times daily. (Patient not taking: Reported on 11/10/2020)     No current facility-administered medications on file prior to visit.    ALLERGIES: Allergies  Allergen Reactions   Penicillins Rash    FAMILY HISTORY: Family History  Problem Relation Age of Onset   Alzheimer's disease Paternal Grandfather       Objective:  Blood pressure 122/76, pulse 73, height 5\' 4"  (1.626 m), weight 132 lb (59.9 kg), SpO2 98 %.  General: No acute distress.  Patient appears well-groomed.   Head:  Normocephalic/atraumatic Eyes:  Fundi examined but not visualized Neck: supple, no paraspinal tenderness, full range of motion Heart:  Regular rate  and rhythm Lungs:  Clear to auscultation bilaterally Back: No paraspinal tenderness Neurological Exam: alert and oriented to person, place, and time.  Speech fluent and not dysarthric, language intact.  CN II-XII intact. Bulk and tone normal, muscle strength 5/5 throughout.  Sensation to temperature and vibration intact.  Deep tendon reflexes 2+ throughout, toes downgoing.  Finger to nose testing intact.  Gait normal, Romberg negative.   , DO  CC: Chrystal Shon Millet, MD

## 2021-05-17 ENCOUNTER — Other Ambulatory Visit: Payer: Self-pay

## 2021-05-17 ENCOUNTER — Encounter: Payer: Self-pay | Admitting: Neurology

## 2021-05-17 ENCOUNTER — Other Ambulatory Visit (INDEPENDENT_AMBULATORY_CARE_PROVIDER_SITE_OTHER): Payer: BC Managed Care – PPO

## 2021-05-17 ENCOUNTER — Ambulatory Visit: Payer: BC Managed Care – PPO | Admitting: Neurology

## 2021-05-17 VITALS — BP 122/76 | HR 73 | Ht 64.0 in | Wt 132.0 lb

## 2021-05-17 DIAGNOSIS — G35 Multiple sclerosis: Secondary | ICD-10-CM | POA: Diagnosis not present

## 2021-05-17 LAB — CBC WITH DIFFERENTIAL/PLATELET
Basophils Absolute: 0 10*3/uL (ref 0.0–0.1)
Basophils Relative: 0.4 % (ref 0.0–3.0)
Eosinophils Absolute: 0.1 10*3/uL (ref 0.0–0.7)
Eosinophils Relative: 1.7 % (ref 0.0–5.0)
HCT: 44.8 % (ref 36.0–46.0)
Hemoglobin: 15.4 g/dL — ABNORMAL HIGH (ref 12.0–15.0)
Lymphocytes Relative: 6.5 % — ABNORMAL LOW (ref 12.0–46.0)
Lymphs Abs: 0.5 10*3/uL — ABNORMAL LOW (ref 0.7–4.0)
MCHC: 34.3 g/dL (ref 30.0–36.0)
MCV: 87.7 fl (ref 78.0–100.0)
Monocytes Absolute: 0.6 10*3/uL (ref 0.1–1.0)
Monocytes Relative: 7.9 % (ref 3.0–12.0)
Neutro Abs: 6.7 10*3/uL (ref 1.4–7.7)
Neutrophils Relative %: 83.5 % — ABNORMAL HIGH (ref 43.0–77.0)
Platelets: 263 10*3/uL (ref 150.0–400.0)
RBC: 5.11 Mil/uL (ref 3.87–5.11)
RDW: 13.1 % (ref 11.5–15.5)
WBC: 8.1 10*3/uL (ref 4.0–10.5)

## 2021-05-17 LAB — HEPATIC FUNCTION PANEL
ALT: 23 U/L (ref 0–35)
AST: 21 U/L (ref 0–37)
Albumin: 4.4 g/dL (ref 3.5–5.2)
Alkaline Phosphatase: 59 U/L (ref 39–117)
Bilirubin, Direct: 0.2 mg/dL (ref 0.0–0.3)
Total Bilirubin: 1.5 mg/dL — ABNORMAL HIGH (ref 0.2–1.2)
Total Protein: 7.4 g/dL (ref 6.0–8.3)

## 2021-05-17 LAB — VITAMIN D 25 HYDROXY (VIT D DEFICIENCY, FRACTURES): VITD: 61.79 ng/mL (ref 30.00–100.00)

## 2021-05-17 NOTE — Patient Instructions (Addendum)
Continue Zeposia Continue D3 4000 IU daily Check labs - CBC with diff, LFTs, immunoglobulin panel and vit D today and again in 6 months Repeat MRI of brain with and without contrast in 6 months Follow up in 6 months (after repeat labs and MRI)  Your provider has requested that you have labwork completed today. Please go to [facility] ([id number]) on the second floor of this building before leaving the office today. You do not need to check in. If you are not called within 15 minutes please check with the front desk.

## 2021-05-18 LAB — IGG, IGA, IGM
IgG (Immunoglobin G), Serum: 1038 mg/dL (ref 600–1640)
IgM, Serum: 115 mg/dL (ref 50–300)
Immunoglobulin A: 90 mg/dL (ref 47–310)

## 2021-07-26 DIAGNOSIS — E0590 Thyrotoxicosis, unspecified without thyrotoxic crisis or storm: Secondary | ICD-10-CM | POA: Diagnosis not present

## 2021-07-26 DIAGNOSIS — E0500 Thyrotoxicosis with diffuse goiter without thyrotoxic crisis or storm: Secondary | ICD-10-CM | POA: Diagnosis not present

## 2021-07-26 DIAGNOSIS — E049 Nontoxic goiter, unspecified: Secondary | ICD-10-CM | POA: Diagnosis not present

## 2021-07-26 DIAGNOSIS — E271 Primary adrenocortical insufficiency: Secondary | ICD-10-CM | POA: Diagnosis not present

## 2021-10-10 ENCOUNTER — Ambulatory Visit: Payer: Self-pay | Admitting: Surgery

## 2021-10-31 ENCOUNTER — Telehealth: Payer: Self-pay | Admitting: Neurology

## 2021-10-31 NOTE — Telephone Encounter (Signed)
LMOVM for pt okay to wait until after surgery for MRI. ?Note added to the notes section of MRI.  ?

## 2021-10-31 NOTE — Telephone Encounter (Signed)
Per pt she has a surgery on her thyroid. Pt wants to know if she could move her MRI back to April? ?

## 2021-10-31 NOTE — Telephone Encounter (Signed)
Patient called and stated she has a question regarding an MRI that was ordered. ?

## 2021-10-31 NOTE — Progress Notes (Addendum)
Anesthesia Review: ? ?PCP: Elie Goody, NP  ?Cardiologist : none  ?Chest x-ray : ?EKG : ?Echo : ?Stress test: ?Cardiac Cath :  ?Activity level: can do a flight of stairs without difficulty  ?Sleep Study/ CPAP : none  ?Fasting Blood Sugar :      / Checks Blood Sugar -- times a day:   ?Blood Thinner/ Instructions /Last Dose: ?ASA / Instructions/ Last Dose :   ?Covid test 11/14/21 at 0930am  ?Spoke with Nelle Don in regards to whether or not pt should take PTU, Cortef, Florinef, and Zeposia am of surgery.  From Anesthesia standpoint it is fine for pt to take am of surgery.  Shanda Bumps Ward asked preop nurse to check with surgeon, DR Gerrit Friends, to see if he wanted pt to take those meds listed above day of surgery.  Called [facility] and spoke with Sharmire in Triage at [facility].  Dr Gerrit Friends was in office and she asked him in regards to meds listed above if he wanted pt to take am of  surgery.  Shamire stated per DR Gerrit Friends okay for pt to take am of surgery.  ?

## 2021-10-31 NOTE — Progress Notes (Addendum)
DUE TO COVID-19 ONLY ONE VISITOR IS ALLOWED TO COME WITH YOU AND STAY IN THE WAITING ROOM ONLY DURING PRE OP AND PROCEDURE DAY OF SURGERY.  2 VISITOR  MAY VISIT WITH YOU AFTER SURGERY IN YOUR PRIVATE ROOM DURING VISITING HOURS ONLY! ?YOU MAY HAVE ONE PERSON SPEND THE NITE WITH YOU IN YOUR ROOM AFTER SURGERY.   ? ?YOU NEED TO HAVE A COVID 19 TEST ON     11/14/21                               COME THRU MAIN ENTRANCE AT [facility] HAVE A SEAT IN THE LOBBY ON THE RIGHT AS YOU COME THRU THE DOOR.  CALL [phone] AND GIVE THEM YOUR NAME AND LET THEM KNOW YOU ARE HERE FOR COVID TESTING.  ? ? Your procedure is scheduled on:  ?      11/17/21  ? Report to [facility] Main  Entrance  ? ? Report to admitting at    0515AM  ?DO NOT BRING INSURANCE CARD, PICTURE ID OR WALLET DAY OF SURGERY.  ?  ? ? Call this number if you have problems the morning of surgery [phone]  ? ? REMEMBER: NO  SOLID FOODS , CANDY, GUM OR MINTS AFTER MIDNITE THE NITE BEFORE SURGERY .       [facility] Kitchen CLEAR LIQUIDS UNTIL      0430AM           DAY OF SURGERY.      PLEASE FINISH ENSURE DRINK PER SURGEON ORDER  WHICH NEEDS TO BE COMPLETED AT    0430AM      MORNING OF SURGERY.   ? ? ? ? ?CLEAR LIQUID DIET ? ? ?Foods Allowed      ?WATER ?BLACK COFFEE ( SUGAR OK, NO MILK, CREAM OR CREAMER) REGULAR AND DECAF  ?TEA ( SUGAR OK NO MILK, CREAM, OR CREAMER) REGULAR AND DECAF  ?PLAIN JELLO ( NO RED)  ?FRUIT ICES ( NO RED, NO FRUIT PULP)  ?POPSICLES ( NO RED)  ?JUICE- APPLE, WHITE GRAPE AND WHITE CRANBERRY  ?SPORT DRINK LIKE GATORADE ( NO RED)  ?CLEAR BROTH ( VEGETABLE , CHICKEN OR BEEF)                                                               ? ?    ? ?BRUSH YOUR TEETH MORNING OF SURGERY AND RINSE YOUR MOUTH OUT, NO CHEWING GUM CANDY OR MINTS. ?  ? ? Take these medicines the morning of surgery with A SIP OF WATER:  PTU, Zeposia, cortef  ? ? ? ?DO NOT TAKE ANY DIABETIC MEDICATIONS DAY OF YOUR SURGERY ?                  ?            You may not have any metal  on your body including hair pins and  ?            piercings  Do not wear jewelry, make-up, lotions, powders or perfumes, deodorant ?            Do not wear nail polish on your fingernails.   ?  IF YOU ARE A FEMALE AND WANT TO SHAVE UNDER ARMS OR LEGS PRIOR TO SURGERY YOU MUST DO SO AT LEAST 48 HOURS PRIOR TO SURGERY.  ?            Men may shave face and neck. ? ? Do not bring valuables to the hospital. [facility] NOT ?            RESPONSIBLE   FOR VALUABLES. ? Contacts, dentures or bridgework may not be worn into surgery. ? Leave suitcase in the car. After surgery it may be brought to your room. ? ?  ? Patients discharged the day of surgery will not be allowed to drive home. IF YOU ARE HAVING SURGERY AND GOING HOME THE SAME DAY, YOU MUST HAVE AN ADULT TO DRIVE YOU HOME AND BE WITH YOU FOR 24 HOURS. YOU MAY GO HOME BY TAXI OR UBER OR ORTHERWISE, BUT AN ADULT MUST ACCOMPANY YOU HOME AND STAY WITH YOU FOR 24 HOURS. ?  ? ?            Please read over the following fact sheets you were given: ?_____________________________________________________________________ ? ?[place] - Preparing for Surgery ?Before surgery, you can play an important role.  Because skin is not sterile, your skin needs to be as free of germs as possible.  You can reduce the number of germs on your skin by washing with CHG (chlorahexidine gluconate) soap before surgery.  CHG is an antiseptic cleaner which kills germs and bonds with the skin to continue killing germs even after washing. ?Please DO NOT use if you have an allergy to CHG or antibacterial soaps.  If your skin becomes reddened/irritated stop using the CHG and inform your nurse when you arrive at Short Stay. ?Do not shave (including legs and underarms) for at least 48 hours prior to the first CHG shower.  You may shave your face/neck. ?Please follow these instructions carefully: ? 1.  Shower with CHG Soap the night before surgery and the  morning of Surgery. ? 2.  If you  choose to wash your hair, wash your hair first as usual with your  normal  shampoo. ? 3.  After you shampoo, rinse your hair and body thoroughly to remove the  shampoo.                           4.  Use CHG as you would any other liquid soap.  You can apply chg directly  to the skin and wash  ?                     Gently with a scrungie or clean washcloth. ? 5.  Apply the CHG Soap to your body ONLY FROM THE NECK DOWN.   Do not use on face/ open      ?                     Wound or open sores. Avoid contact with eyes, ears mouth and genitals (private parts).  ?                     Production manager,  Genitals (private parts) with your normal soap. ?            6.  Wash thoroughly, paying special attention to the area where your surgery  will be performed. ? 7.  Thoroughly rinse your body with warm  water from the neck down. ? 8.  DO NOT shower/wash with your normal soap after using and rinsing off  the CHG Soap. ?               9.  Pat yourself dry with a clean towel. ?           10.  Wear clean pajamas. ?           11.  Place clean sheets on your bed the night of your first shower and do not  sleep with pets. ?Day of Surgery : ?Do not apply any lotions/deodorants the morning of surgery.  Please wear clean clothes to the hospital/surgery center. ? ?FAILURE TO FOLLOW THESE INSTRUCTIONS MAY RESULT IN THE CANCELLATION OF YOUR SURGERY ?PATIENT SIGNATURE_________________________________ ? ?NURSE SIGNATURE__________________________________ ? ?________________________________________________________________________  ? ? ?           ?

## 2021-11-03 NOTE — Telephone Encounter (Signed)
2nd attempt at trying to reach patient no answer. Unable to LVM.  ?

## 2021-11-06 ENCOUNTER — Encounter (HOSPITAL_COMMUNITY)
Admission: RE | Admit: 2021-11-06 | Discharge: 2021-11-06 | Disposition: A | Discharge status: Home / Self Care | Payer: BC Managed Care – PPO | Source: Ambulatory Visit | Attending: Surgery | Admitting: Surgery

## 2021-11-06 ENCOUNTER — Other Ambulatory Visit: Payer: Self-pay

## 2021-11-06 ENCOUNTER — Encounter (HOSPITAL_COMMUNITY): Payer: Self-pay

## 2021-11-06 ENCOUNTER — Ambulatory Visit (HOSPITAL_COMMUNITY)
Admission: RE | Admit: 2021-11-06 | Discharge: 2021-11-06 | Disposition: A | Discharge status: Home / Self Care | Payer: BC Managed Care – PPO | Source: Ambulatory Visit | Attending: Anesthesiology | Admitting: Anesthesiology

## 2021-11-06 VITALS — BP 111/93 | HR 79 | Temp 98.9°F | Resp 14 | Ht 64.0 in | Wt 137.0 lb

## 2021-11-06 DIAGNOSIS — Z01818 Encounter for other preprocedural examination: Secondary | ICD-10-CM | POA: Insufficient documentation

## 2021-11-06 LAB — CBC
HCT: 41.1 % (ref 36.0–46.0)
Hemoglobin: 14.2 g/dL (ref 12.0–15.0)
MCH: 30.4 pg (ref 26.0–34.0)
MCHC: 34.5 g/dL (ref 30.0–36.0)
MCV: 88 fL (ref 80.0–100.0)
Platelets: 245 10*3/uL (ref 150–400)
RBC: 4.67 MIL/uL (ref 3.87–5.11)
RDW: 12.8 % (ref 11.5–15.5)
WBC: 5.9 10*3/uL (ref 4.0–10.5)
nRBC: 0 % (ref 0.0–0.2)

## 2021-11-14 ENCOUNTER — Encounter (HOSPITAL_COMMUNITY): Payer: BC Managed Care – PPO

## 2021-11-14 ENCOUNTER — Other Ambulatory Visit (HOSPITAL_COMMUNITY): Payer: BC Managed Care – PPO

## 2021-11-14 DIAGNOSIS — E0500 Thyrotoxicosis with diffuse goiter without thyrotoxic crisis or storm: Secondary | ICD-10-CM | POA: Diagnosis present

## 2021-11-14 NOTE — H&P (Signed)
? ? ? ? ?REFERRING PHYSICIAN: Katina Degree, MD ? ?PROVIDER: Gerod Caligiuri Charlotta Newton, MD ? ?Chief Complaint: New Consultation Mary Collins' disease) ? ? ?History of Present Illness: ? ?Patient is referred by Dr. Dagmar Hait for surgical evaluation and management of Graves' disease. Patient was diagnosed with hyperthyroidism approximately 2 years ago. She is currently taking propylthiouracil 50 mg twice daily. A recent TSH level from late November 2022 remained undetectable at less than 0.01. Free T4 was in the upper normal range at 1.01. Patient has had intermittent tremors. She has noted some intermittent hoarseness. She does have a globus sensation. She has some tenderness in the thyroid. Patient also notes ocular changes. She has not been seen by her ophthalmologist. Patient does have a history of multiple sclerosis. She has had vision loss in the past. That was transient. Patient has had no prior head or neck surgery. She had no history of thyroid disease prior to onset of Graves' disease. There is a family history of thyroid carcinoma in a maternal cousin. Patient presents today for surgical evaluation on referral from endocrinology. ? ?Review of Systems: ?A complete review of systems was obtained from the patient. I have reviewed this information and discussed as appropriate with the patient. See HPI as well for other ROS. ? ?Review of Systems  ?Constitutional: Negative.  ?HENT: Positive for sore throat.  ?Eyes: Negative.  ?Respiratory: Negative.  ?Cardiovascular: Negative.  ?Gastrointestinal: Negative.  ?Genitourinary: Negative.  ?Musculoskeletal: Negative.  ?Skin: Negative.  ?Neurological: Positive for tremors.  ?Endo/Heme/Allergies: Negative.  ?Psychiatric/Behavioral: The patient is nervous/anxious.  ? ? ?Medical History: ?Past Medical History:  ?Diagnosis Date  ? Thyroid disease  ? ?Patient Active Problem List  ?Diagnosis  ? Graves' disease with exophthalmos  ? ?History reviewed. No pertinent surgical history.   ? ?Allergies  ?Allergen Reactions  ? Diroximel Fumarate Rash  ? Methimazole Rash  ? Penicillins Rash  ? ?Current Outpatient Medications on File Prior to Visit  ?Medication Sig Dispense Refill  ? cholecalciferol (VITAMIN D3) 1000 unit capsule Take by mouth  ? fludrocortisone (FLORINEF) 0.1 mg tablet Take 1 tablet by mouth once daily  ? hydrocortisone (CORTEF) 10 MG tablet TAKE 1 AND 1/2 TABLETS EACH MORNING AT BETWEEN 6AM-8AM AND 1/2 AROUND 2PM TWICE A DAY FOR 90 DAYS.  ? propylthiouraciL (PTU) 50 mg tablet Take 50 mg by mouth 2 (two) times daily  ? ZEPOSIA 0.92 mg Cap  ? ?No current facility-administered medications on file prior to visit.  ? ?History reviewed. No pertinent family history.  ? ?Social History  ? ?Tobacco Use  ?Smoking Status Former  ? Types: Cigarettes  ?Smokeless Tobacco Never  ? ? ?Social History  ? ?Socioeconomic History  ? Marital status: Unknown  ?Tobacco Use  ? Smoking status: Former  ?Types: Cigarettes  ? Smokeless tobacco: Never  ?Vaping Use  ? Vaping Use: Unknown  ?Substance and Sexual Activity  ? Alcohol use: Yes  ? Drug use: Never  ? ?Objective:  ? ?Vitals:  ?BP: 118/70  ?Pulse: 85  ?Temp: 36.8 ?C (98.2 ?F)  ?SpO2: 98%  ?Weight: 61.2 kg (135 lb)  ?Height: 162.6 cm (5\' 4" )  ? ?Body mass index is 23.17 kg/m?. ? ?Physical Exam  ? ?GENERAL APPEARANCE ?Development: normal ?Nutritional status: normal ?Gross deformities: none ? ?SKIN ?Rash, lesions, ulcers: none ?Induration, erythema: none ?Nodules: none palpable ? ?EYES ?Conjunctiva and lids: normal ?Pupils: equal and reactive ?Iris: normal bilaterally ?Some bilateral ocular prominence consistent with hyperthyroidism ? ?EARS, NOSE, MOUTH, THROAT ?External  ears: no lesion or deformity ?External nose: no lesion or deformity ?Hearing: grossly normal ?Due to Covid-19 pandemic, patient is wearing a mask. ? ?NECK ?Symmetric: yes ?Trachea: midline ?Thyroid: no palpable nodules in the thyroid bed; thyroid is mildly enlarged, slightly firm, and mildly  tender to palpation; there is no associated lymphadenopathy ? ?CHEST ?Respiratory effort: normal ?Retraction or accessory muscle use: no ?Breath sounds: normal bilaterally ?Rales, rhonchi, wheeze: none ? ?CARDIOVASCULAR ?Auscultation: regular rhythm, normal rate ?Murmurs: none ?Pulses: radial pulse 2+ palpable ?Lower extremity edema: none ? ?MUSCULOSKELETAL ?Station and gait: normal ?Digits and nails: no clubbing or cyanosis ?Muscle strength: grossly normal all extremities ?Range of motion: grossly normal all extremities ?Deformity: none ? ?LYMPHATIC ?Cervical: none palpable ?Supraclavicular: none palpable ? ?PSYCHIATRIC ?Oriented to person, place, and time: yes ?Mood and affect: normal for situation ?Judgment and insight: appropriate for situation ? ?Assessment and Plan:  ? ?Graves' disease with exophthalmos ? ? ?Patient presents on referral from Dr. Delrae Rend for surgical evaluation of Mary Collins' disease. Patient has had a discussion with Dr. Buddy Duty regarding treatment with radioactive iodine versus surgical resection. Patient does likely have ocular involvement which would make surgical treatment preferable. Also the patient's personal preference is for surgical management. ? ?Today we discussed total thyroidectomy as the treatment of choice for managing Graves' disease. We discussed the risk and benefits of the procedure including the risk of recurrent laryngeal nerve injury and injury to parathyroid glands. We discussed the size and location of the surgical incision. We discussed the hospital stay to be anticipated. We discussed her postoperative recovery and return to work and activities. We discussed the need for lifelong thyroid hormone replacement. The patient understands and wishes to proceed with surgery in the near future. ? ?The risks and benefits of the procedure have been discussed at length with the patient. The patient understands the proposed procedure, potential alternative treatments, and the  course of recovery to be expected. All of the patient's questions have been answered at this time. The patient wishes to proceed with surgery. ? ?Patient provided with a copy of "The Thyroid Book: Medical and Surgical Treatment of Thyroid Problems", published by [employer], 16 pages. Book reviewed and explained to patient during visit today.  ? ?Armandina Gemma, MD ?[facility] ?A [facility] practice ?Office: [phone] ? ? ?

## 2021-11-15 NOTE — Progress Notes (Signed)
PT called in today and had questions in regards to hibiclens. Reviewed with pt she is to take shower with hibiclens the nite before surgery and the morning of surgery.  Chin to toes not on face or private parts and to use no lotions , powders, perfumes or deodorant.  Once she starts hibiclens showers.  PT voiced understanding.  ?

## 2021-11-16 NOTE — Anesthesia Preprocedure Evaluation (Addendum)
Anesthesia Evaluation  ?Patient identified by MRN, date of birth, ID band ?Patient awake ? ? ? ?Reviewed: ?Allergy & Precautions, NPO status , Patient's Chart, lab work & pertinent test results ? ?Airway ?Mallampati: I ? ?TM Distance: >3 FB ?Neck ROM: Full ? ? ? Dental ?no notable dental hx. ? ?  ?Pulmonary ?Patient abstained from smoking., former smoker,  ?  ?Pulmonary exam normal ? ? ? ? ? ? ? Cardiovascular ?negative cardio ROS ?Normal cardiovascular exam ? ? ?  ?Neuro/Psych ?MS (multiple sclerosis)  ?negative psych ROS  ? 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  ?Endo/Other  ?Hyperthyroidism  ? Renal/GU ?negative Renal ROS  ? ?  ?Musculoskeletal ?negative musculoskeletal ROS ?(+)  ? Abdominal ?  ?Peds ? Hematology ?negative hematology ROS ?(+)   ?Anesthesia Other Findings ?GRAVE'S DISEASE ? Reproductive/Obstetrics ?hcg negative ? ?  ? ? ? ? ? ? ? ? ? ? ? ? ? ?  ?  ? ? ? ? ? ? ? ?Anesthesia Physical ?Anesthesia Plan ? ?ASA: 2 ? ?Anesthesia Plan: General  ? ?Post-op Pain Management:   ? ?Induction: Intravenous ? ?PONV Risk Score and Plan: 3 and Ondansetron, Dexamethasone, Midazolam and Treatment may vary due to age or medical condition ? ?Airway Management Planned: Oral ETT ? ?Additional Equipment:  ? ?Intra-op Plan:  ? ?Post-operative Plan: Extubation in OR ? ?Informed Consent: I have reviewed the patients History and Physical, chart, labs and discussed the procedure including the risks, benefits and alternatives for the proposed anesthesia with the patient or authorized representative who has indicated his/her understanding and acceptance.  ? ? ? ?Dental advisory given ? ?Plan Discussed with: CRNA ? ?Anesthesia Plan Comments:   ? ? ? ? ? ?Anesthesia Quick Evaluation ? ?

## 2021-11-17 ENCOUNTER — Other Ambulatory Visit: Payer: Self-pay

## 2021-11-17 ENCOUNTER — Encounter (HOSPITAL_COMMUNITY): Payer: Self-pay | Admitting: Surgery

## 2021-11-17 ENCOUNTER — Encounter (HOSPITAL_COMMUNITY)
Admission: RE | Disposition: A | Discharge status: Home / Self Care | Payer: Self-pay | Source: Home / Self Care | Attending: Surgery

## 2021-11-17 ENCOUNTER — Ambulatory Visit (HOSPITAL_COMMUNITY)
Admission: RE | Admit: 2021-11-17 | Discharge: 2021-11-18 | Disposition: A | Discharge status: Home / Self Care | Payer: BC Managed Care – PPO | Attending: Surgery | Admitting: Surgery

## 2021-11-17 ENCOUNTER — Ambulatory Visit (HOSPITAL_COMMUNITY): Payer: BC Managed Care – PPO | Admitting: Physician Assistant

## 2021-11-17 ENCOUNTER — Ambulatory Visit (HOSPITAL_COMMUNITY): Payer: BC Managed Care – PPO | Admitting: Anesthesiology

## 2021-11-17 DIAGNOSIS — Z79899 Other long term (current) drug therapy: Secondary | ICD-10-CM | POA: Insufficient documentation

## 2021-11-17 DIAGNOSIS — E0500 Thyrotoxicosis with diffuse goiter without thyrotoxic crisis or storm: Secondary | ICD-10-CM | POA: Diagnosis not present

## 2021-11-17 DIAGNOSIS — Z808 Family history of malignant neoplasm of other organs or systems: Secondary | ICD-10-CM | POA: Insufficient documentation

## 2021-11-17 DIAGNOSIS — Z01818 Encounter for other preprocedural examination: Secondary | ICD-10-CM

## 2021-11-17 DIAGNOSIS — G35 Multiple sclerosis: Secondary | ICD-10-CM | POA: Diagnosis not present

## 2021-11-17 HISTORY — PX: THYROIDECTOMY: SHX17

## 2021-11-17 LAB — PREGNANCY, URINE: Preg Test, Ur: NEGATIVE

## 2021-11-17 SURGERY — THYROIDECTOMY
Anesthesia: General

## 2021-11-17 MED ORDER — OZANIMOD HCL 0.92 MG PO CAPS
0.9200 mg | ORAL_CAPSULE | Freq: Every day | ORAL | Status: DC
  Filled 2021-11-17: qty 1

## 2021-11-17 MED ORDER — CHLORHEXIDINE GLUCONATE 0.12 % MT SOLN
15.0000 mL | Freq: Once | OROMUCOSAL | Status: AC
  Administered 2021-11-17: 15 mL via OROMUCOSAL

## 2021-11-17 MED ORDER — KETOROLAC TROMETHAMINE 30 MG/ML IJ SOLN
30.0000 mg | Freq: Once | INTRAMUSCULAR | Status: AC | PRN
  Administered 2021-11-17: 30 mg via INTRAVENOUS

## 2021-11-17 MED ORDER — ONDANSETRON HCL 4 MG/2ML IJ SOLN: 4.0000 mg | Freq: Four times a day (QID) | INTRAMUSCULAR | Status: DC | PRN

## 2021-11-17 MED ORDER — TRAMADOL HCL 50 MG PO TABS: 50.0000 mg | ORAL_TABLET | Freq: Four times a day (QID) | ORAL | 0 refills | Status: DC | PRN

## 2021-11-17 MED ORDER — FENTANYL CITRATE PF 50 MCG/ML IJ SOSY
25.0000 ug | PREFILLED_SYRINGE | INTRAMUSCULAR | Status: DC | PRN
  Administered 2021-11-17: 50 ug via INTRAVENOUS

## 2021-11-17 MED ORDER — ONDANSETRON 4 MG PO TBDP: 4.0000 mg | ORAL_TABLET | Freq: Four times a day (QID) | ORAL | Status: DC | PRN

## 2021-11-17 MED ORDER — HYDROCORTISONE 5 MG PO TABS: 5.0000 mg | ORAL_TABLET | ORAL | Status: DC

## 2021-11-17 MED ORDER — ACETAMINOPHEN 325 MG PO TABS
650.0000 mg | ORAL_TABLET | Freq: Four times a day (QID) | ORAL | Status: DC | PRN
  Administered 2021-11-17 – 2021-11-18 (×3): 650 mg via ORAL
  Filled 2021-11-17 (×3): qty 2

## 2021-11-17 MED ORDER — ACETAMINOPHEN 650 MG RE SUPP: 650.0000 mg | Freq: Four times a day (QID) | RECTAL | Status: DC | PRN

## 2021-11-17 MED ORDER — FENTANYL CITRATE PF 50 MCG/ML IJ SOSY
PREFILLED_SYRINGE | INTRAMUSCULAR | Status: AC
  Filled 2021-11-17: qty 3

## 2021-11-17 MED ORDER — ROCURONIUM BROMIDE 10 MG/ML (PF) SYRINGE
PREFILLED_SYRINGE | INTRAVENOUS | Status: AC
  Filled 2021-11-17: qty 10

## 2021-11-17 MED ORDER — AMISULPRIDE (ANTIEMETIC) 5 MG/2ML IV SOLN: 10.0000 mg | Freq: Once | INTRAVENOUS | Status: DC | PRN

## 2021-11-17 MED ORDER — CHLORHEXIDINE GLUCONATE CLOTH 2 % EX PADS: 6.0000 | MEDICATED_PAD | Freq: Once | CUTANEOUS | Status: DC

## 2021-11-17 MED ORDER — HYDROCORTISONE 5 MG PO TABS
5.0000 mg | ORAL_TABLET | Freq: Every evening | ORAL | Status: DC
  Administered 2021-11-17: 5 mg via ORAL
  Filled 2021-11-17: qty 1

## 2021-11-17 MED ORDER — MENTHOL 3 MG MT LOZG
1.0000 | LOZENGE | OROMUCOSAL | Status: DC | PRN
  Administered 2021-11-17: 3 mg via ORAL
  Filled 2021-11-17: qty 9

## 2021-11-17 MED ORDER — CALCIUM CARBONATE 1250 (500 CA) MG PO TABS
2.0000 | ORAL_TABLET | Freq: Three times a day (TID) | ORAL | Status: DC
Start: 2021-11-17 — End: 2021-11-18
  Administered 2021-11-17 – 2021-11-18 (×3): 1000 mg via ORAL
  Filled 2021-11-17 (×4): qty 1

## 2021-11-17 MED ORDER — HYDROMORPHONE HCL 1 MG/ML IJ SOLN: 1.0000 mg | INTRAMUSCULAR | Status: DC | PRN

## 2021-11-17 MED ORDER — FENTANYL CITRATE (PF) 250 MCG/5ML IJ SOLN
INTRAMUSCULAR | Status: AC
Start: 2021-11-17 — End: ?
  Filled 2021-11-17: qty 5

## 2021-11-17 MED ORDER — PROPOFOL 10 MG/ML IV BOLUS
INTRAVENOUS | Status: AC
  Filled 2021-11-17: qty 20

## 2021-11-17 MED ORDER — PROPOFOL 10 MG/ML IV BOLUS
INTRAVENOUS | Status: DC | PRN
  Administered 2021-11-17: 150 mg via INTRAVENOUS
  Administered 2021-11-17: 50 mg via INTRAVENOUS

## 2021-11-17 MED ORDER — ROCURONIUM BROMIDE 100 MG/10ML IV SOLN
INTRAVENOUS | Status: DC | PRN
  Administered 2021-11-17: 60 mg via INTRAVENOUS
  Administered 2021-11-17: 15 mg via INTRAVENOUS
  Administered 2021-11-17: 10 mg via INTRAVENOUS

## 2021-11-17 MED ORDER — OXYCODONE HCL 5 MG/5ML PO SOLN: 5.0000 mg | Freq: Once | ORAL | Status: DC | PRN

## 2021-11-17 MED ORDER — LIDOCAINE HCL (CARDIAC) PF 100 MG/5ML IV SOSY
PREFILLED_SYRINGE | INTRAVENOUS | Status: DC | PRN
  Administered 2021-11-17: 50 mg via INTRAVENOUS

## 2021-11-17 MED ORDER — DEXAMETHASONE SODIUM PHOSPHATE 10 MG/ML IJ SOLN
INTRAMUSCULAR | Status: DC | PRN
  Administered 2021-11-17: 5 mg via INTRAVENOUS

## 2021-11-17 MED ORDER — TRAMADOL HCL 50 MG PO TABS: 50.0000 mg | ORAL_TABLET | Freq: Four times a day (QID) | ORAL | Status: DC | PRN

## 2021-11-17 MED ORDER — SUGAMMADEX SODIUM 200 MG/2ML IV SOLN
INTRAVENOUS | Status: DC | PRN
  Administered 2021-11-17: 200 mg via INTRAVENOUS

## 2021-11-17 MED ORDER — LACTATED RINGERS IV SOLN: INTRAVENOUS | Status: DC

## 2021-11-17 MED ORDER — ONDANSETRON HCL 4 MG/2ML IJ SOLN
INTRAMUSCULAR | Status: DC | PRN
Start: 2021-11-17 — End: 2021-11-17
  Administered 2021-11-17: 4 mg via INTRAVENOUS

## 2021-11-17 MED ORDER — FLUDROCORTISONE ACETATE 0.1 MG PO TABS
0.1000 mg | ORAL_TABLET | Freq: Every day | ORAL | Status: DC
Start: 2021-11-18 — End: 2021-11-18
  Administered 2021-11-18: 0.1 mg via ORAL
  Filled 2021-11-17: qty 1

## 2021-11-17 MED ORDER — OXYCODONE HCL 5 MG PO TABS: 5.0000 mg | ORAL_TABLET | Freq: Once | ORAL | Status: DC | PRN

## 2021-11-17 MED ORDER — DIPHENHYDRAMINE HCL 50 MG/ML IJ SOLN
INTRAMUSCULAR | Status: DC | PRN
  Administered 2021-11-17: 12.5 mg via INTRAVENOUS

## 2021-11-17 MED ORDER — OXYCODONE HCL 5 MG PO TABS
ORAL_TABLET | ORAL | Status: AC
  Filled 2021-11-17: qty 1

## 2021-11-17 MED ORDER — LIDOCAINE HCL (PF) 2 % IJ SOLN
INTRAMUSCULAR | Status: AC
  Filled 2021-11-17: qty 5

## 2021-11-17 MED ORDER — KETOROLAC TROMETHAMINE 30 MG/ML IJ SOLN
INTRAMUSCULAR | Status: AC
  Filled 2021-11-17: qty 1

## 2021-11-17 MED ORDER — HYDROCORTISONE 10 MG PO TABS
15.0000 mg | ORAL_TABLET | Freq: Every day | ORAL | Status: DC
  Administered 2021-11-18: 15 mg via ORAL
  Filled 2021-11-17: qty 1

## 2021-11-17 MED ORDER — ONDANSETRON HCL 4 MG/2ML IJ SOLN
INTRAMUSCULAR | Status: AC
  Filled 2021-11-17: qty 2

## 2021-11-17 MED ORDER — LEVOTHYROXINE SODIUM 100 MCG PO TABS: 100.0000 ug | ORAL_TABLET | Freq: Every day | ORAL | 2 refills | Status: DC

## 2021-11-17 MED ORDER — 0.9 % SODIUM CHLORIDE (POUR BTL) OPTIME
TOPICAL | Status: DC | PRN
  Administered 2021-11-17: 1000 mL

## 2021-11-17 MED ORDER — FENTANYL CITRATE (PF) 100 MCG/2ML IJ SOLN
INTRAMUSCULAR | Status: DC | PRN
Start: 2021-11-17 — End: 2021-11-17
  Administered 2021-11-17 (×2): 50 ug via INTRAVENOUS
  Administered 2021-11-17 (×4): 25 ug via INTRAVENOUS
  Administered 2021-11-17: 50 ug via INTRAVENOUS

## 2021-11-17 MED ORDER — DEXAMETHASONE SODIUM PHOSPHATE 10 MG/ML IJ SOLN
INTRAMUSCULAR | Status: AC
  Filled 2021-11-17: qty 1

## 2021-11-17 MED ORDER — MIDAZOLAM HCL 5 MG/5ML IJ SOLN
INTRAMUSCULAR | Status: DC | PRN
  Administered 2021-11-17 (×2): 1 mg via INTRAVENOUS

## 2021-11-17 MED ORDER — CALCIUM CARBONATE ANTACID 500 MG PO CHEW: 2.0000 | CHEWABLE_TABLET | Freq: Two times a day (BID) | ORAL | 1 refills | Status: DC

## 2021-11-17 MED ORDER — ORAL CARE MOUTH RINSE: 15.0000 mL | Freq: Once | OROMUCOSAL | Status: AC

## 2021-11-17 MED ORDER — CIPROFLOXACIN IN D5W 400 MG/200ML IV SOLN
400.0000 mg | INTRAVENOUS | Status: AC
Start: 2021-11-17 — End: 2021-11-17
  Administered 2021-11-17: 400 mg via INTRAVENOUS
  Filled 2021-11-17: qty 200

## 2021-11-17 MED ORDER — ACETAMINOPHEN 500 MG PO TABS
1000.0000 mg | ORAL_TABLET | Freq: Once | ORAL | Status: AC
Start: 2021-11-17 — End: 2021-11-17
  Administered 2021-11-17: 1000 mg via ORAL
  Filled 2021-11-17: qty 2

## 2021-11-17 MED ORDER — SODIUM CHLORIDE 0.45 % IV SOLN: INTRAVENOUS | Status: DC

## 2021-11-17 MED ORDER — HEMOSTATIC AGENTS (NO CHARGE) OPTIME
TOPICAL | Status: DC | PRN
Start: 2021-11-17 — End: 2021-11-17
  Administered 2021-11-17: 1 via TOPICAL

## 2021-11-17 MED ORDER — MIDAZOLAM HCL 2 MG/2ML IJ SOLN
INTRAMUSCULAR | Status: AC
  Filled 2021-11-17: qty 2

## 2021-11-17 MED ORDER — OXYCODONE HCL 5 MG PO TABS: 5.0000 mg | ORAL_TABLET | ORAL | Status: DC | PRN

## 2021-11-17 SURGICAL SUPPLY — 32 items
ATTRACTOMAT 16X20 MAGNETIC DRP (DRAPES) ×2 IMPLANT
BAG COUNTER SPONGE SURGICOUNT (BAG) ×2 IMPLANT
BLADE SURG 15 STRL LF DISP TIS (BLADE) ×1 IMPLANT
BLADE SURG 15 STRL SS (BLADE) ×1
CHLORAPREP W/TINT 26 (MISCELLANEOUS) ×2 IMPLANT
CLIP TI MEDIUM 6 (CLIP) ×4 IMPLANT
CLIP TI WIDE RED SMALL 6 (CLIP) ×7 IMPLANT
COVER SURGICAL LIGHT HANDLE (MISCELLANEOUS) ×2 IMPLANT
DERMABOND ADVANCED (GAUZE/BANDAGES/DRESSINGS) ×1
DERMABOND ADVANCED .7 DNX12 (GAUZE/BANDAGES/DRESSINGS) ×1 IMPLANT
DRAPE LAPAROTOMY T 98X78 PEDS (DRAPES) ×2 IMPLANT
DRAPE UTILITY XL STRL (DRAPES) ×2 IMPLANT
ELECT PENCIL ROCKER SW 15FT (MISCELLANEOUS) ×2 IMPLANT
ELECT REM PT RETURN 15FT ADLT (MISCELLANEOUS) ×2 IMPLANT
GAUZE 4X4 16PLY ~~LOC~~+RFID DBL (SPONGE) ×2 IMPLANT
GLOVE SURG SYN 7.5  E (GLOVE) ×2
GLOVE SURG SYN 7.5 E (GLOVE) ×2 IMPLANT
GLOVE SURG SYN 7.5 PF PI (GLOVE) ×2 IMPLANT
GOWN STRL REUS W/ TWL XL LVL3 (GOWN DISPOSABLE) ×2 IMPLANT
GOWN STRL REUS W/TWL XL LVL3 (GOWN DISPOSABLE) ×2
HEMOSTAT SURGICEL 2X4 FIBR (HEMOSTASIS) ×2 IMPLANT
ILLUMINATOR WAVEGUIDE N/F (MISCELLANEOUS) ×2 IMPLANT
KIT BASIN OR (CUSTOM PROCEDURE TRAY) ×2 IMPLANT
KIT TURNOVER KIT A (KITS) IMPLANT
PACK BASIC VI WITH GOWN DISP (CUSTOM PROCEDURE TRAY) ×2 IMPLANT
SHEARS HARMONIC 9CM CVD (BLADE) ×2 IMPLANT
SUT MNCRL AB 4-0 PS2 18 (SUTURE) ×2 IMPLANT
SUT VIC AB 3-0 SH 18 (SUTURE) ×4 IMPLANT
SYR BULB IRRIG 60ML STRL (SYRINGE) ×2 IMPLANT
TOWEL OR 17X26 10 PK STRL BLUE (TOWEL DISPOSABLE) ×2 IMPLANT
TOWEL OR NON WOVEN STRL DISP B (DISPOSABLE) ×2 IMPLANT
TUBING CONNECTING 10 (TUBING) ×2 IMPLANT

## 2021-11-17 NOTE — Transfer of Care (Signed)
Immediate Anesthesia Transfer of Care Note ? ?Patient: Mary Collins ? ?Procedure(s) Performed: TOTAL THYROIDECTOMY ? ?Patient Location: PACU ? ?Anesthesia Type:General ? ?Level of Consciousness: awake, alert , oriented and patient cooperative ? ?Airway & Oxygen Therapy: Patient Spontanous Breathing and Patient connected to face mask oxygen ? ?Post-op Assessment: Report given to RN and Post -op Vital signs reviewed and stable ? ?Post vital signs: Reviewed and stable ? ?Last Vitals:  ?Vitals Value Taken Time  ?BP 124/78 11/17/21 0930  ?Temp    ?Pulse 74 11/17/21 0932  ?Resp 17 11/17/21 0932  ?SpO2 100 % 11/17/21 0932  ?Vitals shown include unvalidated device data. ? ?Last Pain:  ?Vitals:  ? 11/17/21 0552  ?TempSrc: Oral  ?PainSc:   ?   ? ?  ? ?Complications: No notable events documented. ?

## 2021-11-17 NOTE — Interval H&P Note (Signed)
History and Physical Interval Note: ? ?11/17/2021 ?7:07 AM ? ?Mary Collins  has presented today for surgery, with the diagnosis of GRAVE'S DISEASE.  The various methods of treatment have been discussed with the patient and family. After consideration of risks, benefits and other options for treatment, the patient has consented to ? ?  Procedure(s): ?TOTAL THYROIDECTOMY (N/A) as a surgical intervention.   ? ?The patient's history has been reviewed, patient examined, no change in status, stable for surgery.  I have reviewed the patient's chart and labs.  Questions were answered to the patient's satisfaction.   ? ?Darnell Level, MD ?[facility] ?A [facility] practice ?Office: [phone] ? ? ?Darnell Level ? ? ?

## 2021-11-17 NOTE — Anesthesia Postprocedure Evaluation (Signed)
Anesthesia Post Note ? ?Patient: Mary Collins ? ?Procedure(s) Performed: TOTAL THYROIDECTOMY ? ?  ? ?Patient location during evaluation: PACU ?Anesthesia Type: General ?Level of consciousness: awake ?Pain management: pain level controlled ?Vital Signs Assessment: post-procedure vital signs reviewed and stable ?Respiratory status: spontaneous breathing, nonlabored ventilation, respiratory function stable and patient connected to nasal cannula oxygen ?Cardiovascular status: blood pressure returned to baseline and stable ?Postop Assessment: no apparent nausea or vomiting ?Anesthetic complications: no ? ? ?No notable events documented. ? ?Last Vitals:  ?Vitals:  ? 11/17/21 1508 11/17/21 1613  ?BP: 101/63 100/78  ?Pulse: (!) 58 (!) 57  ?Resp: 14 16  ?Temp: 36.7 ?C 37.1 ?C  ?SpO2: 99% 99%  ?  ?Last Pain:  ?Vitals:  ? 11/17/21 1613  ?TempSrc: Oral  ?PainSc: 2   ? ? ?  ?  ?  ?  ?  ?  ? ?Krysteena Stalker P [place] ? ? ? ? ?

## 2021-11-17 NOTE — Discharge Instructions (Signed)
[facility] - Dr. Natayah Warmack  THYROID & PARATHYROID SURGERY:  POST-OP INSTRUCTIONS  Always review the instruction sheet provided by the hospital nurse at discharge.  A prescription for pain medication may be sent to your pharmacy at the time of discharge.  Take your pain medication as prescribed.  If narcotic pain medicine is not needed, then you may take acetaminophen (Tylenol) or ibuprofen (Advil) as needed for pain or soreness.  Take your normal home medications as prescribed unless otherwise directed.  If you need a refill on your pain medication, please contact the office during regular business hours.  Prescriptions will not be processed by the office after 5:00PM or on weekends.  Start with a light diet upon arrival home, such as soup and crackers or toast.  Be sure to drink plenty of fluids.  Resume your normal diet the day after surgery.  Most patients will experience some swelling and bruising on the chest and neck area.  Ice packs will help for the first 48 hours after arriving home.  Swelling and bruising will take several days to resolve.   It is common to experience some constipation after surgery.  Increasing fluid intake and taking a stool softener (Colace) will usually help to prevent this problem.  A mild laxative (Milk of Magnesia or Miralax) should be taken according to package directions if there has been no bowel movement after 48 hours.  Dermabond glue covers your incision. This seals the wound and you may shower at any time. The Dermabond will remain in place for about a week.  You may gradually remove the glue when it loosens around the edges.  If you need to loosen the Dermabond for removal, apply a layer of Vaseline to the wound for 15 minutes and then remove with a Kleenex. Your sutures are under the skin and will not show - they will dissolve on their own.  You may resume light daily activities beginning the day after discharge (such as self-care,  walking, climbing stairs), gradually increasing activities as tolerated. You may have sexual intercourse when it is comfortable. Refrain from any heavy lifting or straining until approved by your doctor. You may drive when you no longer are taking prescription pain medication, you can comfortably wear a seatbelt, and you can safely maneuver your car and apply the brakes.  You will see your doctor in the office for a follow-up appointment approximately three weeks after your surgery.  Make sure that you call for this appointment within a day or two after you arrive home to insure a convenient appointment time. Please have any requested laboratory tests performed a few days prior to your office visit so that the results will be available at your follow up appointment.  WHEN TO CALL THE [facility] OFFICE: -- Fever greater than 101.5 -- Inability to urinate -- Nausea and/or vomiting - persistent -- Extreme swelling or bruising -- Continued bleeding from incision -- Increased pain, redness, or drainage from the incision -- Difficulty swallowing or breathing -- Muscle cramping or spasms -- Numbness or tingling in hands or around lips  The clinic staff is available to answer your questions during regular business hours.  Please don't hesitate to call and ask to speak to one of the nurses if you have concerns.  [facility] OFFICE: [phone] (24 hours)  Please sign up for MyChart accounts. This will allow you to communicate directly with my nurse or myself without having to call the office. It will also allow you   to view your test results. You will need to enroll in MyChart for my office ([facility]) and for the hospital ([facility]).  Olof Marcil, MD [facility] A [facility] practice 

## 2021-11-17 NOTE — Anesthesia Procedure Notes (Signed)
Procedure Name: Intubation ?Date/Time: 11/17/2021 7:42 AM ?Performed by: Garrel Ridgel, CRNA ?Pre-anesthesia Checklist: Patient identified, Emergency Drugs available, Suction available and Patient being monitored ?Patient Re-evaluated:Patient Re-evaluated prior to induction ?Oxygen Delivery Method: Circle system utilized ?Preoxygenation: Pre-oxygenation with 100% oxygen ?Induction Type: IV induction ?Ventilation: Mask ventilation without difficulty ?Laryngoscope Size: Mac and 3 ?Grade View: Grade I ?Tube type: Oral ?Tube size: 7.0 mm ?Number of attempts: 1 ?Airway Equipment and Method: Stylet and Oral airway ?Placement Confirmation: ETT inserted through vocal cords under direct vision, positive ETCO2 and breath sounds checked- equal and bilateral ?Secured at: 22 cm ?Tube secured with: Tape ?Dental Injury: Teeth and Oropharynx as per pre-operative assessment  ? ? ? ? ?

## 2021-11-17 NOTE — Op Note (Signed)
Procedure Note ? ?Pre-operative Diagnosis:  Graves' disease ? ?Post-operative Diagnosis:  same ? ?Surgeon:  Darnell Level, MD ? ?Assistant:  none  ? ?Procedure:  Total thyroidectomy ? ?Anesthesia:  General ? ?Estimated Blood Loss:  minimal ? ?Drains: none ?        ?Specimen: thyroid to pathology ? ?Indications:  Patient is referred by Dr. Debara Pickett for surgical evaluation and management of Graves' disease. Patient was diagnosed with hyperthyroidism approximately 2 years ago. She is currently taking propylthiouracil 50 mg twice daily. A recent TSH level from late November 2022 remained undetectable at less than 0.01. Free T4 was in the upper normal range at 1.01. Patient has had intermittent tremors. She has noted some intermittent hoarseness. She does have a globus sensation. She has some tenderness in the thyroid. Patient also notes ocular changes. She has not been seen by her ophthalmologist. Patient does have a history of multiple sclerosis. She has had vision loss in the past. That was transient. Patient has had no prior head or neck surgery. She had no history of thyroid disease prior to onset of Graves' disease. There is a family history of thyroid carcinoma in a maternal cousin. Patient presents today for surgical evaluation on referral from endocrinology. ? ?Procedure Details: Procedure was done in OR #1 at the [facility]. The patient was brought to the operating room and placed in a supine position on the operating room table. Following administration of general anesthesia, the patient was positioned and then prepped and draped in the usual aseptic fashion. After ascertaining that an adequate level of anesthesia had been achieved, a small Kocher incision was made with #15 blade. Dissection was carried through subcutaneous tissues and platysma.Hemostasis was achieved with the electrocautery. Skin flaps were elevated cephalad and caudad from the thyroid notch to the sternal notch. A Mahorner  self-retaining retractor was placed for exposure. Strap muscles were incised in the midline and dissection was begun on the left side.  Strap muscles were reflected laterally.  Left thyroid lobe was minimally enlarged without nodules.  The left lobe was gently mobilized with blunt dissection. Superior pole vessels were dissected out and divided individually between small and medium ligaclips with the harmonic scalpel. The thyroid lobe was rolled anteriorly. Branches of the inferior thyroid artery were divided between small ligaclips with the harmonic scalpel. Inferior venous tributaries were divided between ligaclips. Both the superior and inferior parathyroid glands were identified and preserved on their vascular pedicles. The recurrent laryngeal nerve was identified and preserved along its course. The ligament of Allyson Sabal was released with the electrocautery and the gland was mobilized onto the anterior trachea. Isthmus was mobilized across the midline. There was a small pyramidal lobe present which was resected with the isthmus. Dry pack was placed in the left neck. ? ?The right thyroid lobe was gently mobilized with blunt dissection. Right thyroid lobe was minimally enlarged without nodules. Superior pole vessels were dissected out and divided between small and medium ligaclips with the Harmonic scalpel. Superior parathyroid was identified and preserved. Inferior venous tributaries were divided between medium ligaclips with the harmonic scalpel. The right thyroid lobe was rolled anteriorly and the branches of the inferior thyroid artery divided between small ligaclips. The right recurrent laryngeal nerve was identified and preserved along its course. The ligament of Allyson Sabal was released with the electrocautery. The right thyroid lobe was mobilized onto the anterior trachea and the remainder of the thyroid was dissected off the anterior trachea and the thyroid was  completely excised. A suture was used to mark the left  lobe. The entire thyroid gland was submitted to pathology for review. ? ?The neck was irrigated with warm saline. Fibrillar was placed throughout the operative field. Strap muscles were approximated in the midline with interrupted 3-0 Vicryl sutures. Platysma was closed with interrupted 3-0 Vicryl sutures. Skin was closed with a running 4-0 Monocryl subcuticular suture. Wound was washed and Dermabond was applied. The patient was awakened from anesthesia and brought to the recovery room. The patient tolerated the procedure well. ? ? ?Darnell Level, MD ?[facility] ?Office: [phone]  ?

## 2021-11-18 ENCOUNTER — Encounter (HOSPITAL_COMMUNITY): Payer: Self-pay | Admitting: Surgery

## 2021-11-18 DIAGNOSIS — E0500 Thyrotoxicosis with diffuse goiter without thyrotoxic crisis or storm: Secondary | ICD-10-CM | POA: Diagnosis not present

## 2021-11-18 LAB — BASIC METABOLIC PANEL
Anion gap: 5 (ref 5–15)
BUN: 11 mg/dL (ref 6–20)
CO2: 25 mmol/L (ref 22–32)
Calcium: 8.8 mg/dL — ABNORMAL LOW (ref 8.9–10.3)
Chloride: 106 mmol/L (ref 98–111)
Creatinine, Ser: 0.74 mg/dL (ref 0.44–1.00)
GFR, Estimated: >60 mL/min (ref >60)
Glucose, Bld: 101 mg/dL — ABNORMAL HIGH (ref 70–99)
Potassium: 3.9 mmol/L (ref 3.5–5.1)
Sodium: 136 mmol/L (ref 135–145)

## 2021-11-18 MED ORDER — OZANIMOD HCL 0.92 MG PO CAPS: 0.9200 mg | ORAL_CAPSULE | Freq: Every day | ORAL | Status: DC

## 2021-11-18 NOTE — Progress Notes (Signed)
1 Day Post-Op  ? ?Subjective/Chief Complaint: ?Doing well no complaints  ? ? ?Objective: ?Vital signs in last 24 hours: ?Temp:  [96.2 ?F (35.7 ?C)-98.7 ?F (37.1 ?C)] 98.1 ?F (36.7 ?C) (03/25 0753) ?Pulse Rate:  [56-91] 66 (03/25 0753) ?Resp:  [10-17] 16 (03/25 0753) ?BP: (97-131)/(60-85) 103/60 (03/25 0753) ?SpO2:  [98 %-100 %] 99 % (03/25 0753) ?Last BM Date : 11/16/21 ? ?Intake/Output from previous day: ?03/24 0701 - 03/25 0700 ?In: 2975.8 [P.O.:1080; I.V.:1895.8] ?Out: 20 [Blood:20] ?Intake/Output this shift: ?No intake/output data recorded. ? ?General appearance: alert and cooperative ?Resp: clear to auscultation bilaterally ?Incision/Wound: CDI no hematoma  ?No stridor or hoarseness  ?Lab Results:  ?No results for input(s): WBC, HGB, HCT, PLT in the last 72 hours. ?BMET ?Recent Labs  ?  11/18/21 ?0420  ?NA 136  ?K 3.9  ?CL 106  ?CO2 25  ?GLUCOSE 101*  ?BUN 11  ?CREATININE 0.74  ?CALCIUM 8.8*  ? ?PT/INR ?No results for input(s): LABPROT, INR in the last 72 hours. ?ABG ?No results for input(s): PHART, HCO3 in the last 72 hours. ? ?Invalid input(s): PCO2, PO2 ? ?Studies/Results: ?No results found. ? ?Anti-infectives: ?Anti-infectives (From admission, onward)  ? ? Start     Dose/Rate Route Frequency Ordered Stop  ? 11/17/21 0600  ciprofloxacin (CIPRO) IVPB 400 mg       ? 400 mg ?200 mL/hr over 60 Minutes Intravenous On call to O.R. 11/17/21 0814 11/17/21 4818  ? ?  ? ? ?Assessment/Plan: ?s/p Procedure(s): ?TOTAL THYROIDECTOMY (N/A) ?Discharge ? LOS: 0 days  ? ? ?Dortha Schwalbe MD  ?11/18/2021 ? ?

## 2021-11-18 NOTE — Discharge Summary (Signed)
Physician Discharge Summary  ?Patient ID: ?Mary Collins ?MRN: [id number] ?DOB/AGE: 03/27/1986 36 y.o. ? ?Admit date: 11/17/2021 ?Discharge date: 11/18/2021 ? ?Admission Diagnoses:Graves disease  ? ?Discharge Diagnoses:  ?Principal Problem: ?  Graves' disease ?Active Problems: ?  Graves disease ? ? ?Discharged Condition: good ? ?Hospital Course: pt did well ?Tolerated her diet, had good pain control and could ambulate  ?Ca 8.8  ?No hoarseness of clinical signs of hypocalcemia  ? ?Consults: None ? ?Significant Diagnostic Studies: labs: BMET ?   ?Component Value Date/Time  ? NA 136 11/18/2021 0420  ? K 3.9 11/18/2021 0420  ? CL 106 11/18/2021 0420  ? CO2 25 11/18/2021 0420  ? GLUCOSE 101 (H) 11/18/2021 0420  ? BUN 11 11/18/2021 0420  ? CREATININE 0.74 11/18/2021 0420  ? CALCIUM 8.8 (L) 11/18/2021 0420  ? GFRNONAA >60 11/18/2021 0420  ?  ? ?Treatments: surgery: thyroidectomy ? ?Discharge Exam: ?Blood pressure 103/60, pulse 66, temperature 98.1 ?F (36.7 ?C), temperature source Oral, resp. rate 16, height 5\' 4"  (1.626 m), weight 62.1 kg, last menstrual period 10/24/2021, SpO2 99 %. ?General appearance: alert and cooperative ?Neck: incision CDI no hematoma no stridor or hoarseness  ?Resp: clear to auscultation bilaterally ?Cardio: NSR  ? ?Disposition: Discharge disposition: 01-Home or Self Care ? ? ? ? ? ? ?Discharge Instructions   ? ? Diet - low sodium heart healthy   Complete by: As directed ?  ? Increase activity slowly   Complete by: As directed ?  ? ?  ? ?Allergies as of 11/18/2021   ? ?   Reactions  ? Diroximel Fumarate Rash  ? Other reaction(s): rash  ? Methimazole Rash  ? Other reaction(s): rash  ? Penicillins Rash  ? ?  ? ?  ?Medication List  ?  ? ?TAKE these medications   ? ?acetaminophen 325 MG tablet ?Commonly known as: TYLENOL ?Take 650 mg by mouth every 6 (six) hours as needed for moderate pain. ?  ?calcium carbonate 500 MG chewable tablet ?Commonly known as: Tums ?Chew 2 tablets (400 mg of elemental calcium  total) by mouth 2 (two) times daily. ?  ?Cholecalciferol 25 MCG (1000 UT) capsule ?Take 2,000 Units by mouth daily. ?  ?diazepam 5 MG tablet ?Commonly known as: VALIUM ?Take 1 tablet 30 to 40 minutes prior to MRI. ?  ?EMERGEN-C IMMUNE PO ?Take 1 Package by mouth daily. ?  ?fexofenadine 180 MG tablet ?Commonly known as: ALLEGRA ?Take 180 mg by mouth daily as needed for allergies or rhinitis. ?  ?fludrocortisone 0.1 MG tablet ?Commonly known as: FLORINEF ?Take 0.1 mg by mouth daily. ?  ?hydrocortisone 10 MG tablet ?Commonly known as: CORTEF ?Take 5-15 mg by mouth See admin instructions. Take 15 mg by mouth in the morning between 0600-0800 and 5 mg at 1400 ?  ?ibuprofen 200 MG tablet ?Commonly known as: ADVIL ?Take 200 mg by mouth every 6 (six) hours as needed for headache or moderate pain. ?  ?levothyroxine 100 MCG tablet ?Commonly known as: Synthroid ?Take 1 tablet (100 mcg total) by mouth daily before breakfast. ?  ?traMADol 50 MG tablet ?Commonly known as: ULTRAM ?Take 1-2 tablets (50-100 mg total) by mouth every 6 (six) hours as needed for moderate pain. ?  ?Zeposia 0.92 MG Caps ?Generic drug: Ozanimod HCl ?Take 0.92 mg by mouth in the morning. ?  ? ?  ? ? Follow-up Information   ? ? 11/20/2021, MD. Schedule an appointment as soon as possible for a visit in 3 week(s).   ?  Specialty: General Surgery ?Why: For wound re-check ?Contact information: ?[address] ?[address] ?[place] [address] ?[phone] ? ? ?  ?  ? ?  ?  ? ?  ? ? ?Signed: ?Dortha Schwalbe MD  ?11/18/2021, 8:52 AM ? ? ?

## 2021-11-20 LAB — SURGICAL PATHOLOGY

## 2021-11-24 ENCOUNTER — Ambulatory Visit: Payer: BC Managed Care – PPO | Admitting: Neurology

## 2021-11-24 ENCOUNTER — Other Ambulatory Visit: Payer: Self-pay | Admitting: Neurology

## 2021-11-28 ENCOUNTER — Telehealth: Payer: Self-pay | Admitting: Neurology

## 2021-11-28 DIAGNOSIS — G35 Multiple sclerosis: Secondary | ICD-10-CM

## 2021-11-28 NOTE — Telephone Encounter (Signed)
Patient said the place she gets her MS meds from stated she was out of refills and she wants to know. We denied refills.  ?Zeposia 0.92mg  caps ? ?She said if she cant get a refill please let her know  ?

## 2021-11-29 MED ORDER — ZEPOSIA 0.92 MG PO CAPS: 0.9200 mg | ORAL_CAPSULE | Freq: Every morning | ORAL | 0 refills | Status: DC

## 2021-11-29 NOTE — Addendum Note (Signed)
Addended by: Leida Lauth on: 11/29/2021 02:51 PM ? ? Modules accepted: Orders ? ?

## 2021-11-29 NOTE — Telephone Encounter (Signed)
Advised pt to have labs and MRI done before her appt on 12/22/21. ? ?Also check labs CBC w/diff, LFTs, quantitative immunoglobulin panel and Vit D prior to follow up on 4/28.  ? ?Zeposia sent in. ? ?Labs faxed to patient address per pt. ?

## 2021-11-29 NOTE — Telephone Encounter (Signed)
New order added

## 2021-11-29 NOTE — Telephone Encounter (Signed)
Pt called back in after being told by [facility] that they don't have an order for an MRI ?

## 2021-12-01 ENCOUNTER — Other Ambulatory Visit (HOSPITAL_COMMUNITY): Payer: Self-pay

## 2021-12-01 ENCOUNTER — Telehealth: Payer: Self-pay

## 2021-12-01 NOTE — Telephone Encounter (Signed)
Patient Advocate Encounter ?  ?Received notification from [facility] that prior authorization for Zeposia 0.92MG  capsules is required by his/her insurance [facility]. ?  ?PA submitted on 12/01/21 ? ?Key#: [id number] ? ?Status is pending ?   ?[facility] will continue to follow: ? ?Patient Advocate ?Fax: [phone]  ?

## 2021-12-02 LAB — CBC WITH DIFFERENTIAL
Basophils Absolute: 0 10*3/uL (ref 0.0–0.2)
Basos: 0 %
EOS (ABSOLUTE): 0.1 10*3/uL (ref 0.0–0.4)
Eos: 1 %
Hematocrit: 43.8 % (ref 34.0–46.6)
Hemoglobin: 14.7 g/dL (ref 11.1–15.9)
Immature Grans (Abs): 0 10*3/uL (ref 0.0–0.1)
Immature Granulocytes: 0 %
Lymphocytes Absolute: 0.3 10*3/uL — ABNORMAL LOW (ref 0.7–3.1)
Lymphs: 4 %
MCH: 29.5 pg (ref 26.6–33.0)
MCHC: 33.6 g/dL (ref 31.5–35.7)
MCV: 88 fL (ref 79–97)
Monocytes Absolute: 0.4 10*3/uL (ref 0.1–0.9)
Monocytes: 5 %
Neutrophils Absolute: 6.4 10*3/uL (ref 1.4–7.0)
Neutrophils: 90 %
RBC: 4.98 x10E6/uL (ref 3.77–5.28)
RDW: 12.6 % (ref 11.7–15.4)
WBC: 7.1 10*3/uL (ref 3.4–10.8)

## 2021-12-02 LAB — IGG, IGA, IGM
IgA/Immunoglobulin A, Serum: 84 mg/dL — ABNORMAL LOW (ref 87–352)
IgG (Immunoglobin G), Serum: 1046 mg/dL (ref 586–1602)
IgM (Immunoglobulin M), Srm: 147 mg/dL (ref 26–217)

## 2021-12-02 LAB — HEPATIC FUNCTION PANEL
ALT: 18 IU/L (ref 0–32)
AST: 19 IU/L (ref 0–40)
Albumin: 4.7 g/dL (ref 3.8–4.8)
Alkaline Phosphatase: 59 IU/L (ref 44–121)
Bilirubin Total: 0.8 mg/dL (ref 0.0–1.2)
Bilirubin, Direct: 0.2 mg/dL (ref 0.00–0.40)
Total Protein: 7.2 g/dL (ref 6.0–8.5)

## 2021-12-02 LAB — VITAMIN D 25 HYDROXY (VIT D DEFICIENCY, FRACTURES): Vit D, 25-Hydroxy: 41.3 ng/mL (ref 30.0–100.0)

## 2021-12-04 ENCOUNTER — Telehealth: Payer: Self-pay | Admitting: Neurology

## 2021-12-04 MED ORDER — ZEPOSIA 0.92 MG PO CAPS: 0.9200 mg | ORAL_CAPSULE | Freq: Every morning | ORAL | 0 refills | Status: DC

## 2021-12-04 NOTE — Telephone Encounter (Signed)
Patient Advocate Encounter ? ?Prior Authorization for Zeposia 0.92MG  capsules has been approved.   ? ?PA# [id number] ?Effective dates: 12/01/2021 through 11/30/2022 ? ? ? ? ? ?Roland Earl, CPhT ?Pharmacy Patient Advocate Specialist ?[facility] Patient Advocate Team ?Direct Number: [phone]  Fax: [phone]  ?

## 2021-12-04 NOTE — Telephone Encounter (Signed)
Patient called and stated her prescription for Zeposia was sent to the wrong pharmacy.  [facility] and she needed it sent to [facility]. ?

## 2021-12-04 NOTE — Telephone Encounter (Signed)
Advised patient medication was sent to [facility] on 11/29/21. ? ?Per Patient [facility] told her they never received the script for Zeposia.  ? ?Resent script, Advised pt to call me if she has in further issues.  ?

## 2021-12-07 ENCOUNTER — Other Ambulatory Visit (HOSPITAL_COMMUNITY): Payer: Self-pay

## 2021-12-18 ENCOUNTER — Other Ambulatory Visit: Payer: Self-pay | Admitting: Neurology

## 2021-12-18 ENCOUNTER — Telehealth: Payer: Self-pay | Admitting: Neurology

## 2021-12-18 MED ORDER — DIAZEPAM 5 MG PO TABS: ORAL_TABLET | ORAL | 0 refills | Status: DC

## 2021-12-18 NOTE — Telephone Encounter (Signed)
Patient advised of script sent to the pharmacy.  ?Please have a driver and take [id number] minutes before appt.  ?

## 2021-12-18 NOTE — Telephone Encounter (Signed)
Patient states that she needs something to help her with nerves and anxiety with her MRI on Wednesday  she thinks she got Diazapam last time for her MRI  ? ?[facility] [place]  ?

## 2021-12-20 ENCOUNTER — Ambulatory Visit
Admission: RE | Admit: 2021-12-20 | Discharge: 2021-12-20 | Disposition: A | Discharge status: Home / Self Care | Payer: BC Managed Care – PPO | Source: Ambulatory Visit | Attending: Neurology | Admitting: Neurology

## 2021-12-20 DIAGNOSIS — G35 Multiple sclerosis: Secondary | ICD-10-CM

## 2021-12-20 MED ORDER — GADOBENATE DIMEGLUMINE 529 MG/ML IV SOLN
12.0000 mL | Freq: Once | INTRAVENOUS | Status: AC | PRN
  Administered 2021-12-20: 12 mL via INTRAVENOUS

## 2021-12-20 NOTE — Progress Notes (Signed)
? ?Due to the COVID-19 crisis, this telephone visit was done via telephone from my office and it was initiated and consent given by this patient and or family. ? ?Telephone (Audio) Visit ?Patient was unable to connect for a video office visit and thus switched to a telephone visit.  The purpose of this telephone visit is to provide medical care while limiting exposure to the novel coronavirus.   ? ?Consent was obtained for telephone visit and initiated by pt/family:  Yes.   ?Answered questions that patient had about telehealth interaction:  Yes.   ?I discussed the limitations, risks, security and privacy concerns of performing an evaluation and management service by telephone. I also discussed with the patient that there may be a patient responsible charge related to this service. The patient expressed understanding and agreed to proceed. ? ?Pt location: Home ?Physician Location: office ?Name of referring provider:  Daryl Eastern, MD ?I connected with .Mary Collins at patients initiation/request on 12/22/2021 at  8:50 AM EDT by telephone and verified that I am speaking with the correct person using two identifiers.  ?Pt MRN:  [id number] ?Pt DOB:  05-23-1986 ? ?Assessment/Plan:  ? ?Multiple sclerosis, relapsing-remitting ?  ?DMT:  Zeposia ?2.   Check labs CBC w/diff, LFTs, quantitative immunoglobulin panel and Vit D in 6 months ?3.   Repeat MRI of brain with and without contrast in one year ?Follow up 6 months (after repeat tests) ? ?Need for in-office visit now:  No. ?  ?Subjective:  ?Mary Collins is a 36 year old right-handed Caucasian female with Addison's disease, Graves' disease and anxiety who follows up for multiple sclerosis.  MRI brain performed on 4/26 personally reviewed. ?  ?UPDATE: ?Current DMT:  Zeposia ?Other medications:  none.  Not taking vitamin D as per her surgeon ?  ?12/01/2021 LABS:  CBC with WBC 7.1, HGB 14.7, HCT 43.8, ALC 0.3; Hepatic panel with t bili 0.8, ALP 59, AST 19, ALT 18; IgG  1,046, IgA 84, IgM 147; vit D 41.3 ? ?12/20/2021 MRI BRAIN W/WO:  Patchy FLAIR signal abnormality in the juxtacortical and periventricular white matter consistent with multiple sclerosis is not significantly changed since 2022; however, a lesion in the right frontal lobe white matter is new since the study from 2021.  No enhancement to suggest active demyelination. ?  ?No relapses. ?She had thyroidectomy last month.   ?Vision:  No issues ?Motor:  Normal ?Sensory:  No issues. ?Pain:  No pain ?Gait:  No issues ?Bowel/Bladder:  No issues. ?Fatigue:  No ?Cognition:  Sometimes with word-finding issues. ?Mood:  anxiety ?No Lhermitte's sign ?  ?HISTORY: ?She was diagnosed with multiple sclerosis in October 2020 after she presented with right optic neuritis.  She had been experiencing blurred vision in her right eye for about a week.  Work-up was performed at [facility].  MRI of brain with and without contrast showed extensive white matter lesions in a configuration consistent with MS as well as slight enlargement and mild enhancement of the right optic nerve.  MRI of cervical and thoracic spine showed no cord lesions. She underwent lumbar puncture where CSF showed elevated oligoclonal bands of 10 and IgG index 0.96. Systemic lupus profile was unremarkable.  She was treated with Solu-Medrol followed by a prednisone taper at discharge, but never followed up with outpatient neurology. ? ?In March 2022, she endorsed eye pain with decreased visual acuity.  She saw her ophthalmologist who didn't find any concerning abnormalities.  Due  to side effects, Vumerity was stopped and she was to start Zeposia.  MRI of brain with and without contrast on 12/25/2020 showed new punctate enhancing lesion in the periventricular white matter adjacent to the left lateral ventricle.  As she had just started Zeposia, no change was yet made to DMT.   ?  ?She has Addison's disease.  Since she was diagnosed with MS, she has  also been diagnosed with Grave's disease.  LFTs were slightly elevated (t bili 1.3, AST/ALTs in the mid 100s), attributed to hyperthyroidism ?  ?Ophthalmologic exam from 01/08/2020, including optic nerve/discs, RNFL, and HVF, was unremarkable. ?  ?Imaging: ?12/25/2020 MRI BRAIN W WO:  New punctate focus of enhancement in the periventricular white matter adjacent to the left lateral ventricle. May reflect an area of active demyelination at the site of a chronic demyelinating lesion.  Stable burden of chronic demyelinating lesions. ? 02/17/2020 MRI BRAIN W WO:  more than 20 non-enhancing T2/FLAIR lesions within the periventricular and juxtacortical white matter bilaterally with no infratentorial lesions, reportedly unchanged compared with prior imaging from Oct 2020. ?06/09/2019 MRI BRAIN W WO:  1.  Extensive foci of T2 signal prolongation in the periventricular deep white matter and pericallosal fibers, likely representing multiple sclerosis.  2.  Slight enlargement of the right optic nerves and mild enhancement, consistent with right optic neuritis, also likely a manifestation of multiple sclerosis.  3.  No mass, hemorrhage or acute infarct.  4.  Trace chronic anterior ethmoid sinus disease. ?06/11/2019 MRI CERVICAL/THORACIC SPINE W WO:  1. No evidence of demyelinating lesion within the cervical or thoracic cord.  2. Mild cervical spondylosis at C5-6 and C6-7.  3. No significant degenerative change of the thoracic spine. ?  ?Labs: ?06/09/2019 Serum:  RF negative, sed rate 7, ds-DNA antibody 1, anti-Smith negative, SSA/SSB antibodies negative, antichromatic antibodies negative, B12 328, vitamin D 28,  ?06/10/2019 CSF:  Protein 24, glucose 84, Oligoclonal bands 10 (not in serum), IgG index 0.96, Myelin basic protein 3.3, NMO antibody negative, ACE 1.1, cytology negative, gram stain & culture negative ?01/05/2020 Serum:  CBC with WBC 4.6, HGB 13.5, HCT 39.9, PLT 243, ALC 1.40; CMP with Na 135, K 5.3, Cl 102, CO2 26,  glucose 83, BUN 14, Cr 0.53, t bili 1.3, ALP 47, AST 157, ALT 158; TSH 0.04, free T4 4.68 ?  ?No family history of MS. ? ?Past DMT:  Vumerity (hives) ?  ?Medications:  Cortef, Florinef ?  ?Objective:  ? ?There were no vitals filed for this visit. ? ? ?Follow Up Instructions:  ? ?  ? -I discussed the assessment and treatment plan with the patient. The patient was provided an opportunity to ask questions and all were answered. The patient agreed with the plan and demonstrated an understanding of the instructions. ?  ?The patient was advised to call back or seek an in-person evaluation if the symptoms worsen or if the condition fails to improve as anticipated. ? ? ? ?Total Time spent in visit with the patient was:  10 minutes ? ?Cira Servant, DO ?

## 2021-12-22 ENCOUNTER — Telehealth (INDEPENDENT_AMBULATORY_CARE_PROVIDER_SITE_OTHER): Payer: BC Managed Care – PPO | Admitting: Neurology

## 2021-12-22 ENCOUNTER — Encounter: Payer: Self-pay | Admitting: Neurology

## 2021-12-22 DIAGNOSIS — G35 Multiple sclerosis: Secondary | ICD-10-CM | POA: Diagnosis not present

## 2021-12-22 NOTE — Addendum Note (Signed)
Addended by: Venetia Night on: 12/22/2021 11:39 AM ? ? Modules accepted: Orders ? ?

## 2022-01-01 ENCOUNTER — Other Ambulatory Visit: Payer: Self-pay

## 2022-01-01 MED ORDER — ZEPOSIA 0.92 MG PO CAPS: 0.9200 mg | ORAL_CAPSULE | Freq: Every morning | ORAL | 5 refills | Status: DC

## 2022-01-03 ENCOUNTER — Telehealth: Payer: Self-pay | Admitting: Neurology

## 2022-01-03 NOTE — Telephone Encounter (Signed)
Patient left message with access nurse stating [facility]  She needs her refills sent but the pharmacy is saying she has no refills until June. ?

## 2022-01-04 ENCOUNTER — Telehealth: Payer: Self-pay | Admitting: Neurology

## 2022-01-04 NOTE — Telephone Encounter (Signed)
Telephone call to patient, Per patient she was advised by [facility], She was unable to have her medication shipped top her until June 2nd. ?And that [facility] a request in for the same medication and our office needs to call them to get changed. ?Patient has 11 days left her Zeposia.  ? ? ?Advised patient script went to [facility] but I will call both to see what we can do get her medication sent to her in time. ? ?

## 2022-01-04 NOTE — Telephone Encounter (Signed)
Stated she needs a call from Mary Collins bout her refill on zeposia. Stated it was sent to wrong pharmacy and seems to be messed up. [facility]. She has 11 pills left and it will not last her till her next refill which they are saying is 01/26/22 ?

## 2022-01-04 NOTE — Telephone Encounter (Signed)
See other phone note 01/04/22 ?

## 2022-02-07 IMAGING — MR MR HEAD WO/W CM
13 series · 48 of 48 positions shown · IV contrast (multihance)
Comparison: None.

CLINICAL DATA: Multiple sclerosis

EXAM:
MRI HEAD WITHOUT AND WITH CONTRAST
TECHNIQUE: Multiplanar, multiecho pulse sequences of the brain and surrounding
structures were obtained without and with intravenous contrast.
CONTRAST:  10mL MULTIHANCE GADOBENATE DIMEGLUMINE 529 MG/ML IV SOLN

[Series 5: T1 · sagittal · 4.0mm · 0.75mm/px · 1 of 27 slices shown (1 of 3)]
[im 1/27]
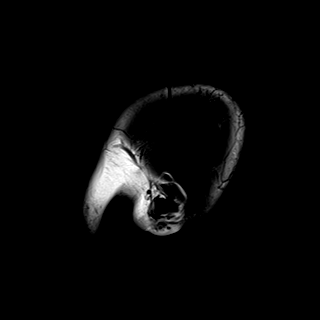

[Series 6: DWI · axial · 3.0mm · 1.44mm/px · z∈[-73,+66]mm · 4 of 88 slices shown (1 of 4)]
[im 1/88]
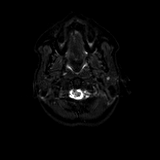
[im 30/88]
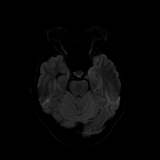
[im 59/88]
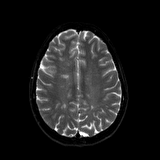
[im 88/88]
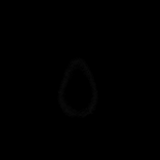

[Series 7: DWI · axial · 3.0mm · 1.44mm/px · z∈[-73,+66]mm · 3 of 44 slices shown (2 of 4)]
[im 1/44]
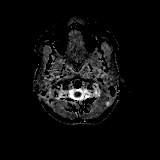
[im 22/44]
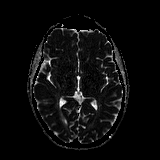
[im 44/44]
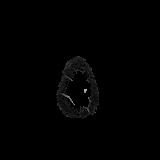

[Series 8: DWI · coronal · 5.0mm · 1.44mm/px · 4 of 66 slices shown (3 of 4)]
[im 1/66]
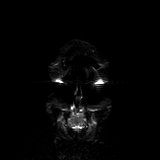
[im 22/66]
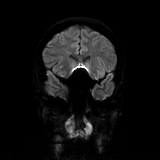
[im 44/66]
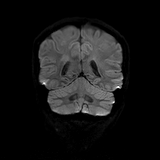
[im 66/66]
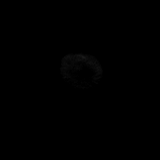

[Series 9: DWI · coronal · 5.0mm · 1.44mm/px · 2 of 33 slices shown (4 of 4)]
[im 1/33]
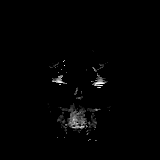
[im 33/33]
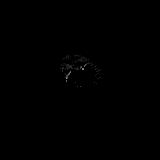

[Series 10: T2 · axial · 4.0mm · 0.36mm/px · z∈[-75,+68]mm · 2 of 29 slices shown]
[im 1/29]
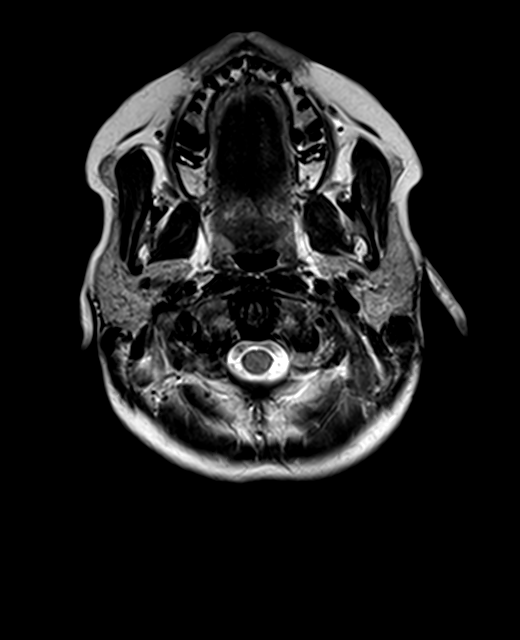
[im 29/29]
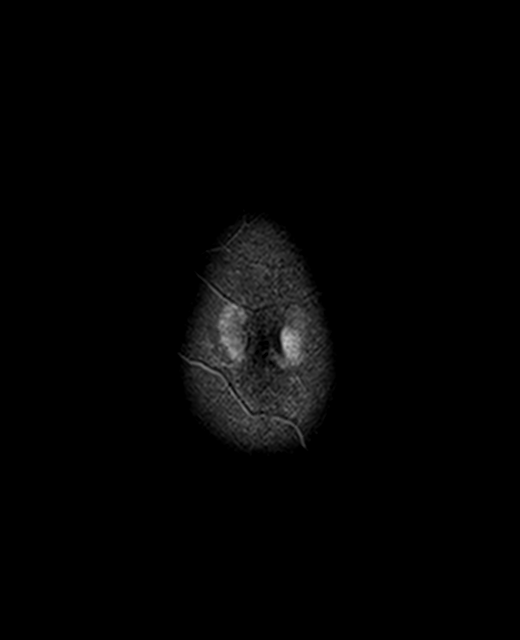

[Series 11: FLAIR · axial · 3.0mm · 0.72mm/px · z∈[-78,+69]mm · 2 of 26 slices shown (1 of 2)]
[im 1/26]
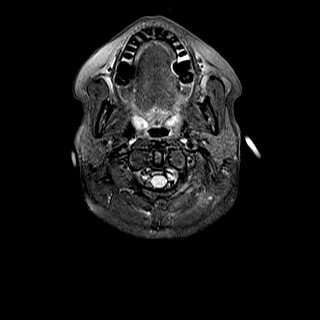
[im 26/26]
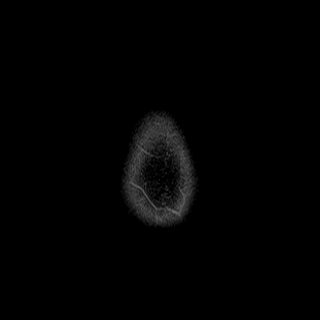

[Series 13: swi_images · axial · 1.5mm · 0.90mm/px · z∈[-73,+67]mm · 6 of 96 slices shown]
[im 1/96]
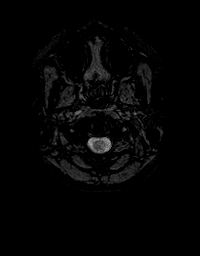
[im 20/96]
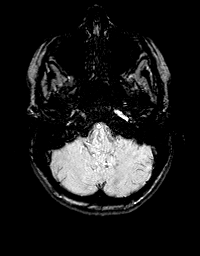
[im 39/96]
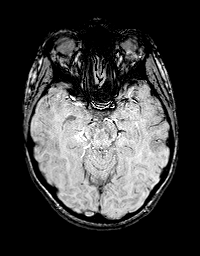
[im 58/96]
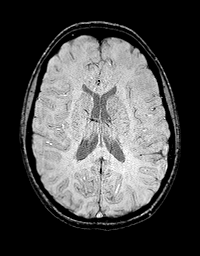
[im 77/96]
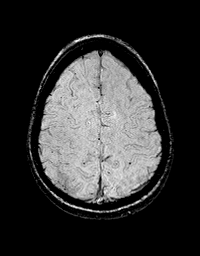
[im 96/96]
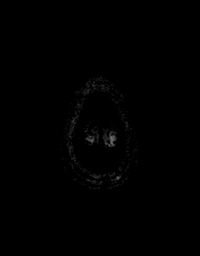

[Series 14: T1 · axial · 1.0mm · 0.94mm/px · z∈[-93,+59]mm · 9 of 159 slices shown (2 of 3)]
[im 1/159]
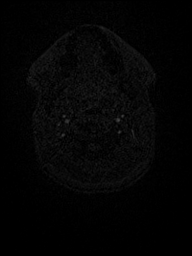
[im 20/159]
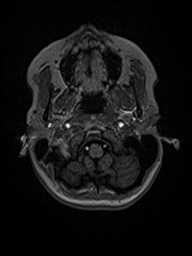
[im 40/159]
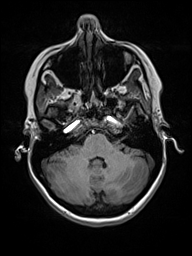
[im 60/159]
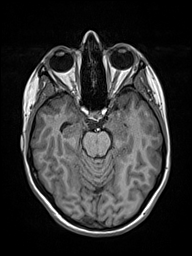
[im 80/159]
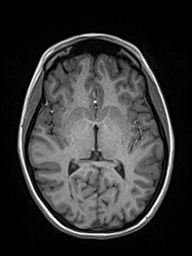
[im 99/159]
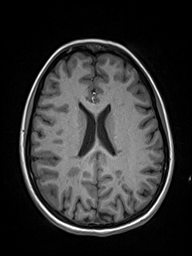
[im 119/159]
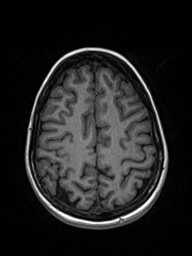
[im 139/159]
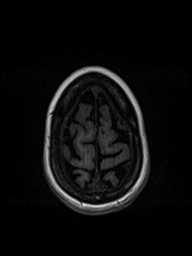
[im 159/159]
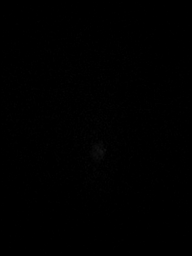

[Series 15: FLAIR · sagittal · 4.0mm · 0.72mm/px · 2 of 27 slices shown (2 of 2)]
[im 1/27]
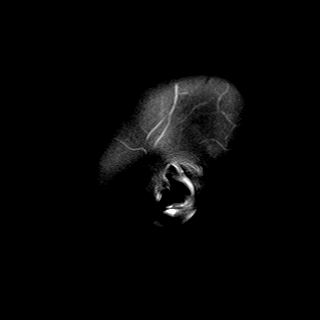
[im 27/27]
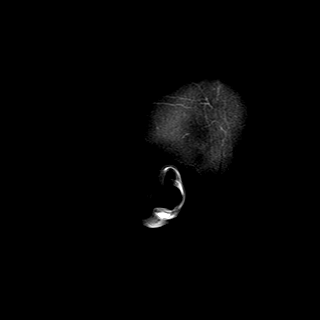

[Series 16: T2 post-contrast · coronal · 4.0mm · 0.36mm/px · 2 of 35 slices shown]
[im 1/35]
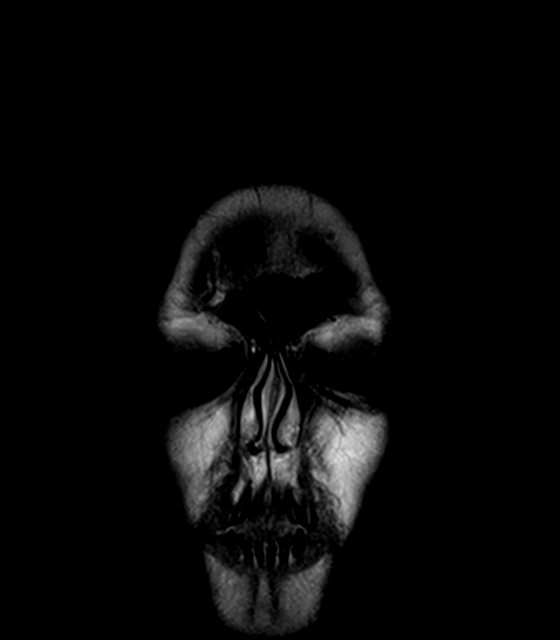
[im 35/35]
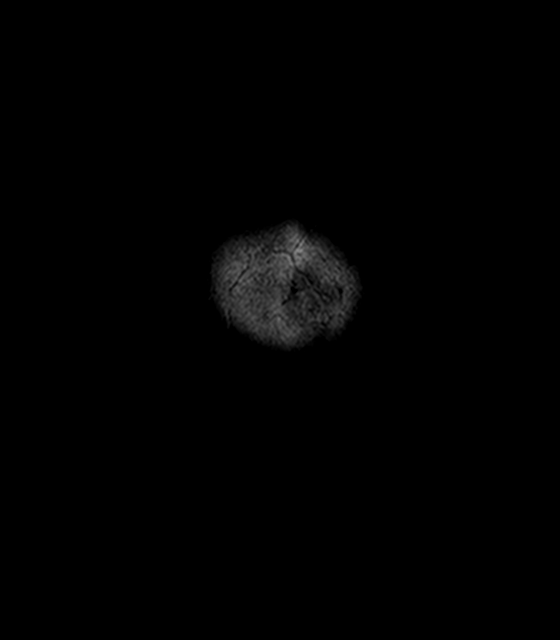

[Series 17: T1 · axial · 1.0mm · 0.94mm/px · z∈[-93,+60]mm · 9 of 160 slices shown (3 of 3)]
[im 1/160]
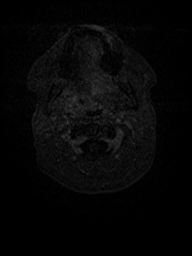
[im 20/160]
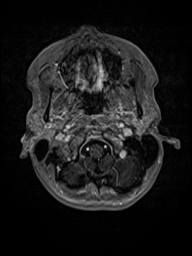
[im 40/160]
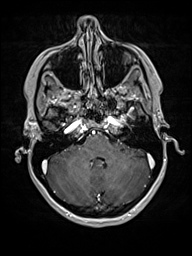
[im 60/160]
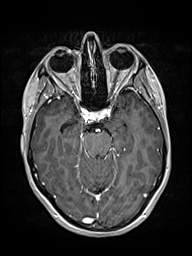
[im 80/160]
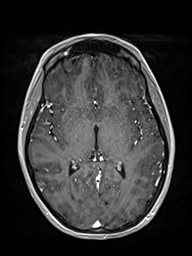
[im 100/160]
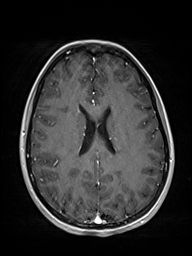
[im 120/160]
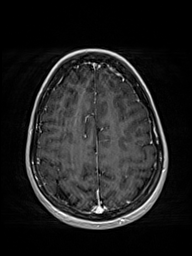
[im 140/160]
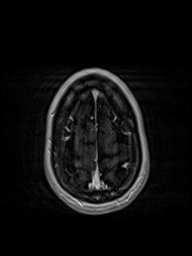
[im 160/160]
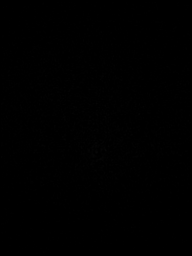

[Series 18: T1 post-contrast · coronal · 4.0mm · 0.72mm/px · 2 of 35 slices shown]
[im 1/35]
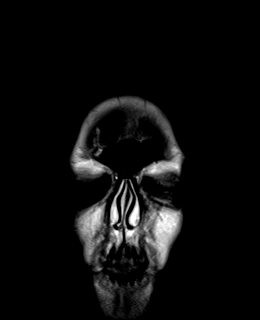
[im 35/35]
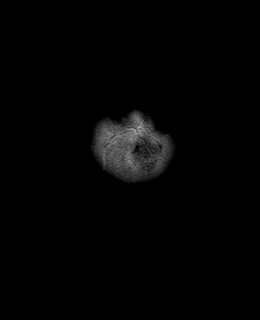

[48 of 48 positions shown; findings below may reference images not displayed]

FINDINGS: Brain: There are more than 20 hyperintense T2-weighted signal
lesions within the periventricular and juxtacortical white matter,
most of which show corresponding low T1-weighted signal. There are
no infratentorial lesions. The number, size and distribution of
lesions is unchanged from the most recent MRI. There are no
contrast-enhancing lesions to indicate active demyelination.

No acute infarct, acute hemorrhage or extra-axial collection. Normal
volume of CSF spaces. No chronic microhemorrhage. Normal midline
structures.

Vascular: Normal flow voids.

Skull and upper cervical spine: Normal marrow signal.

Sinuses/Orbits: Negative.

Other: None.
IMPRESSION: Numerous white matter lesions in a pattern consistent with multiple
sclerosis. No active demyelination. No age advanced atrophy.

## 2022-06-20 NOTE — Progress Notes (Signed)
NEUROLOGY FOLLOW UP OFFICE NOTE  Mary Collins [id number]  Assessment/Plan:   Multiple sclerosis, relapsing-remitting   DMT:  Zeposia 2.   Check labs CBC w/diff, LFTs, quantitative immunoglobulin panel and Vit D today and in 6 months 3.   Repeat MRI of brain with and without contrast in 6 months. Follow up 6 months (after repeat tests)  Subjective:  Mary Collins is a 36 year old right-handed Caucasian female with Addison's disease, Graves' disease and anxiety who follows up for multiple sclerosis.  MRI brain performed on 4/26 personally reviewed.   UPDATE: Current DMT:  Zeposia     No relapses. She had thyroidectomy last month.   Vision:  No issues Motor:  Normal Sensory:  No issues. Pain:  No pain Gait:  No issues Bowel/Bladder:  No issues. Fatigue:  No Cognition:  Sometimes with word-finding issues.  She is under a lot of stress. Mood:  anxiety.  Under stress.  Has a lot of responsibilities.   No Lhermitte's sign   HISTORY: She was diagnosed with multiple sclerosis in October 2020 after she presented with right optic neuritis.  She had been experiencing blurred vision in her right eye for about a week.  Work-up was performed at [facility].  MRI of brain with and without contrast showed extensive white matter lesions in a configuration consistent with MS as well as slight enlargement and mild enhancement of the right optic nerve.  MRI of cervical and thoracic spine showed no cord lesions. She underwent lumbar puncture where CSF showed elevated oligoclonal bands of 10 and IgG index 0.96. Systemic lupus profile was unremarkable.  She was treated with Solu-Medrol followed by a prednisone taper at discharge, but never followed up with outpatient neurology.   In March 2022, she endorsed eye pain with decreased visual acuity.  She saw her ophthalmologist who didn't find any concerning abnormalities.  Due to side effects, Vumerity was stopped and she was  to start Piru.  MRI of brain with and without contrast on 12/25/2020 showed new punctate enhancing lesion in the periventricular white matter adjacent to the left lateral ventricle.  As she had just started Zeposia, no change was yet made to DMT.     She has Addison's disease.  Since she was diagnosed with MS, she has also been diagnosed with Grave's disease.  LFTs were slightly elevated (t bili 1.3, AST/ALTs in the mid 100s), attributed to hyperthyroidism   Ophthalmologic exam from 01/08/2020, including optic nerve/discs, RNFL, and HVF, was unremarkable.   Imaging: 12/20/2021 MRI BRAIN W/WO:  Patchy FLAIR signal abnormality in the juxtacortical and periventricular white matter consistent with multiple sclerosis is not significantly changed since 2022; however, a lesion in the right frontal lobe white matter is new since the study from 2021.  No enhancement to suggest active demyelination. 12/25/2020 MRI BRAIN W WO:  New punctate focus of enhancement in the periventricular white matter adjacent to the left lateral ventricle. May reflect an area of active demyelination at the site of a chronic demyelinating lesion.  Stable burden of chronic demyelinating lesions.  02/17/2020 MRI BRAIN W WO:  more than 20 non-enhancing T2/FLAIR lesions within the periventricular and juxtacortical white matter bilaterally with no infratentorial lesions, reportedly unchanged compared with prior imaging from Oct 2020. 06/09/2019 MRI BRAIN W WO:  1.  Extensive foci of T2 signal prolongation in the periventricular deep white matter and pericallosal fibers, likely representing multiple sclerosis.  2.  Slight enlargement of the right optic  nerves and mild enhancement, consistent with right optic neuritis, also likely a manifestation of multiple sclerosis.  3.  No mass, hemorrhage or acute infarct.  4.  Trace chronic anterior ethmoid sinus disease. 06/11/2019 MRI CERVICAL/THORACIC SPINE W WO:  1. No evidence of demyelinating lesion  within the cervical or thoracic cord.  2. Mild cervical spondylosis at C5-6 and C6-7.  3. No significant degenerative change of the thoracic spine.   Labs: 06/09/2019 Serum:  RF negative, sed rate 7, ds-DNA antibody 1, anti-Smith negative, SSA/SSB antibodies negative, antichromatic antibodies negative, B12 328, vitamin D 28,  06/10/2019 CSF:  Protein 24, glucose 84, Oligoclonal bands 10 (not in serum), IgG index 0.96, Myelin basic protein 3.3, NMO antibody negative, ACE 1.1, cytology negative, gram stain & culture negative 01/05/2020 Serum:  CBC with WBC 4.6, HGB 13.5, HCT 39.9, PLT 243, ALC 1.40; CMP with Na 135, K 5.3, Cl 102, CO2 26, glucose 83, BUN 14, Cr 0.53, t bili 1.3, ALP 47, AST 157, ALT 158; TSH 0.04, free T4 4.68   No family history of MS.  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     calcium carbonate (TUMS) 500 MG chewable tablet Chew 2 tablets (400 mg of elemental calcium total) by mouth 2 (two) times daily. 90 tablet 1   Cholecalciferol 25 MCG (1000 UT) capsule Take 2,000 Units by mouth daily.     diazepam (VALIUM) 5 MG tablet Take 1 tablet 30 to 40 minutes prior to MRI. 1 tablet 0   fexofenadine (ALLEGRA) 180 MG tablet Take 180 mg by mouth daily as needed for allergies or rhinitis.     fludrocortisone (FLORINEF) 0.1 MG tablet Take 0.1 mg by mouth daily.     hydrocortisone (CORTEF) 10 MG tablet Take 5-15 mg by mouth See admin instructions. Take 15 mg by mouth in the morning between 0600-0800 and 5 mg at 1400     ibuprofen (ADVIL) 200 MG tablet Take 200 mg by mouth every 6 (six) hours as needed for headache or moderate  pain.     levothyroxine (SYNTHROID) 100 MCG tablet Take 1 tablet (100 mcg total) by mouth daily before breakfast. 30 tablet 2   Multiple Vitamins-Minerals (EMERGEN-C IMMUNE PO) Take 1 Package by mouth daily.     traMADol (ULTRAM) 50 MG tablet Take 1-2 tablets (50-100 mg total) by mouth every 6 (six) hours as needed for moderate pain. 15 tablet 0   ZEPOSIA 0.92 MG CAPS Take 1 capsule (0.92 mg total) by mouth in the morning. 30 capsule 5   No current facility-administered medications on file prior to visit.    ALLERGIES: Allergies  Allergen Reactions   Diroximel Fumarate Rash    Other reaction(s): rash   Methimazole Rash    Other reaction(s): rash   Penicillins Rash    FAMILY HISTORY: Family History  Problem Relation Age of Onset   Alzheimer's disease Paternal Grandfather       Objective:  Blood pressure 124/78, pulse 64, height 5\' 4"  (1.626 m), weight 140 lb 12.8 oz (63.9 kg), SpO2 99 %. General: No acute distress.  Patient appears well-groomed.  Head:  Normocephalic/atraumatic Eyes:  Fundi examined but not visualized Neck: supple, no paraspinal tenderness, full range of motion Heart:  Regular rate and rhythm Lungs:  Clear to auscultation bilaterally Back: No paraspinal tenderness Neurological Exam: alert and oriented to person, place, and time.  Speech fluent and not dysarthric, language intact.  CN II-XII intact. Bulk and tone normal, muscle strength 5/5 throughout.  Sensation to temperature and vibration intact.  Deep tendon reflexes 2+ throughout, toes downgoing.  Finger to nose testing intact.  Gait normal, Romberg negative.   Metta Clines, DO  CC: Nell Range, NP

## 2022-06-25 ENCOUNTER — Other Ambulatory Visit (INDEPENDENT_AMBULATORY_CARE_PROVIDER_SITE_OTHER): Payer: BC Managed Care – PPO

## 2022-06-25 ENCOUNTER — Other Ambulatory Visit: Payer: Self-pay

## 2022-06-25 ENCOUNTER — Ambulatory Visit: Payer: BC Managed Care – PPO | Admitting: Neurology

## 2022-06-25 ENCOUNTER — Encounter: Payer: Self-pay | Admitting: Neurology

## 2022-06-25 VITALS — BP 124/78 | HR 64 | Ht 64.0 in | Wt 140.8 lb

## 2022-06-25 DIAGNOSIS — G35 Multiple sclerosis: Secondary | ICD-10-CM

## 2022-06-25 LAB — COMPREHENSIVE METABOLIC PANEL
ALT: 11 U/L (ref 0–35)
AST: 16 U/L (ref 0–37)
Albumin: 4.4 g/dL (ref 3.5–5.2)
Alkaline Phosphatase: 34 U/L — ABNORMAL LOW (ref 39–117)
BUN: 20 mg/dL (ref 6–23)
CO2: 23 mEq/L (ref 19–32)
Calcium: 9 mg/dL (ref 8.4–10.5)
Chloride: 102 mEq/L (ref 96–112)
Creatinine, Ser: 0.92 mg/dL (ref 0.40–1.20)
GFR: 79.77 mL/min (ref >60.00)
Glucose, Bld: 88 mg/dL (ref 70–99)
Potassium: 4.4 mEq/L (ref 3.5–5.1)
Sodium: 133 mEq/L — ABNORMAL LOW (ref 135–145)
Total Bilirubin: 1.3 mg/dL — ABNORMAL HIGH (ref 0.2–1.2)
Total Protein: 6.9 g/dL (ref 6.0–8.3)

## 2022-06-25 LAB — CBC WITH DIFFERENTIAL/PLATELET
Basophils Absolute: 0 10*3/uL (ref 0.0–0.1)
Basophils Relative: 0.4 % (ref 0.0–3.0)
Eosinophils Absolute: 0.1 10*3/uL (ref 0.0–0.7)
Eosinophils Relative: 1 % (ref 0.0–5.0)
HCT: 41.7 % (ref 36.0–46.0)
Hemoglobin: 14.3 g/dL (ref 12.0–15.0)
Lymphocytes Relative: 4.4 % — ABNORMAL LOW (ref 12.0–46.0)
Lymphs Abs: 0.3 10*3/uL — ABNORMAL LOW (ref 0.7–4.0)
MCHC: 34.3 g/dL (ref 30.0–36.0)
MCV: 92.3 fl (ref 78.0–100.0)
Monocytes Absolute: 0.5 10*3/uL (ref 0.1–1.0)
Monocytes Relative: 6.4 % (ref 3.0–12.0)
Neutro Abs: 6.6 10*3/uL (ref 1.4–7.7)
Neutrophils Relative %: 87.8 % — ABNORMAL HIGH (ref 43.0–77.0)
Platelets: 238 10*3/uL (ref 150.0–400.0)
RBC: 4.52 Mil/uL (ref 3.87–5.11)
RDW: 13.5 % (ref 11.5–15.5)
WBC: 7.5 10*3/uL (ref 4.0–10.5)

## 2022-06-25 LAB — VITAMIN D 25 HYDROXY (VIT D DEFICIENCY, FRACTURES): VITD: 29.89 ng/mL — ABNORMAL LOW (ref 30.00–100.00)

## 2022-06-25 MED ORDER — ZEPOSIA 0.92 MG PO CAPS: 0.9200 mg | ORAL_CAPSULE | Freq: Every morning | ORAL | 5 refills | Status: DC

## 2022-06-25 NOTE — Patient Instructions (Signed)
Continue zeposia Check CBC with diff

## 2022-06-26 LAB — IGG, IGA, IGM
IgG (Immunoglobin G), Serum: 969 mg/dL (ref 600–1640)
IgM, Serum: 102 mg/dL (ref 50–300)
Immunoglobulin A: 82 mg/dL (ref 47–310)

## 2022-07-04 NOTE — Progress Notes (Signed)
Mailbox full, will send a mychart message to have patient call the office.

## 2022-12-16 ENCOUNTER — Encounter: Payer: Self-pay | Admitting: Neurology

## 2022-12-17 ENCOUNTER — Other Ambulatory Visit: Payer: Self-pay | Admitting: Neurology

## 2022-12-17 MED ORDER — DIAZEPAM 5 MG PO TABS: ORAL_TABLET | ORAL | 0 refills | Status: DC

## 2022-12-20 ENCOUNTER — Ambulatory Visit
Admission: RE | Admit: 2022-12-20 | Discharge: 2022-12-20 | Disposition: A | Discharge status: Home / Self Care | Payer: BC Managed Care – PPO | Source: Ambulatory Visit | Attending: Neurology | Admitting: Neurology

## 2022-12-20 ENCOUNTER — Other Ambulatory Visit: Payer: Self-pay | Admitting: Neurology

## 2022-12-20 DIAGNOSIS — G35 Multiple sclerosis: Secondary | ICD-10-CM

## 2022-12-20 MED ORDER — GADOPICLENOL 0.5 MMOL/ML IV SOLN
6.0000 mL | Freq: Once | INTRAVENOUS | Status: AC | PRN
  Administered 2022-12-20: 6 mL via INTRAVENOUS

## 2022-12-24 NOTE — Progress Notes (Unsigned)
NEUROLOGY FOLLOW UP OFFICE NOTE  Mary Collins [id number]  Assessment/Plan:   Multiple sclerosis, relapsing-remitting   DMT:  Zeposia 2.   Check labs CBC w/diff, LFTs, quantitative immunoglobulin panel and Vit D today and in 6 months 3.   Repeat MRI of brain with and without contrast in 6 months. Follow up 6 months (after repeat tests)  Subjective:  Mary Collins is a 37 year old right-handed Caucasian female with Addison's disease, Graves' disease and anxiety who follows up for multiple sclerosis.  MRI brain performed on 4/25 personally reviewed.   UPDATE: Current DMT:  Zeposia Other:  D3 4000 IU daily  06/25/2022 LABS:  CBC with WBC 7.5, HGB 14.3, HCT 41.7, PLT 238, ALC 0.3; CMP with Na 133, K 4.4, Cl 102, CO2 23, glucose 88, BUN 20, Cr 0.92, t bil 1.3, ALP 34, AST 16, ALT 11; IgA 82, IgG 969, IgM 102; vit D 29.89.   Advised to restart vit D.  12/20/2022 MRI BRAIN W WO:  ***  No relapses. Vision:  No issues Motor:  Normal Sensory:  No issues. Pain:  No pain Gait:  No issues Bowel/Bladder:  No issues. Fatigue:  No Cognition:  Sometimes with word-finding issues.  She is under a lot of stress. Mood:  anxiety.  Under stress.  Has a lot of responsibilities.   No Lhermitte's sign   HISTORY: She was diagnosed with multiple sclerosis in October 2020 after she presented with right optic neuritis.  She had been experiencing blurred vision in her right eye for about a week.  Work-up was performed at [facility].  MRI of brain with and without contrast showed extensive white matter lesions in a configuration consistent with MS as well as slight enlargement and mild enhancement of the right optic nerve.  MRI of cervical and thoracic spine showed no cord lesions. She underwent lumbar puncture where CSF showed elevated oligoclonal bands of 10 and IgG index 0.96. Systemic lupus profile was unremarkable.  She was treated with Solu-Medrol followed by a prednisone  taper at discharge, but never followed up with outpatient neurology.   In March 2022, she endorsed eye pain with decreased visual acuity.  She saw her ophthalmologist who didn't find any concerning abnormalities.  Due to side effects, Vumerity was stopped and she was to start Zeposia.  MRI of brain with and without contrast on 12/25/2020 showed new punctate enhancing lesion in the periventricular white matter adjacent to the left lateral ventricle.  As she had just started Zeposia, no change was yet made to DMT.     She has Addison's disease.  Since she was diagnosed with MS, she has also been diagnosed with Grave's disease.  LFTs were slightly elevated (t bili 1.3, AST/ALTs in the mid 100s), attributed to hyperthyroidism   Ophthalmologic exam from 01/08/2020, including optic nerve/discs, RNFL, and HVF, was unremarkable.   Imaging: 12/20/2021 MRI BRAIN W/WO:  Patchy FLAIR signal abnormality in the juxtacortical and periventricular white matter consistent with multiple sclerosis is not significantly changed since 2022; however, a lesion in the right frontal lobe white matter is new since the study from 2021.  No enhancement to suggest active demyelination. 12/25/2020 MRI BRAIN W WO:  New punctate focus of enhancement in the periventricular white matter adjacent to the left lateral ventricle. May reflect an area of active demyelination at the site of a chronic demyelinating lesion.  Stable burden of chronic demyelinating lesions.  02/17/2020 MRI BRAIN W WO:  more than  20 non-enhancing T2/FLAIR lesions within the periventricular and juxtacortical white matter bilaterally with no infratentorial lesions, reportedly unchanged compared with prior imaging from Oct 2020. 06/09/2019 MRI BRAIN W WO:  1.  Extensive foci of T2 signal prolongation in the periventricular deep white matter and pericallosal fibers, likely representing multiple sclerosis.  2.  Slight enlargement of the right optic nerves and mild  enhancement, consistent with right optic neuritis, also likely a manifestation of multiple sclerosis.  3.  No mass, hemorrhage or acute infarct.  4.  Trace chronic anterior ethmoid sinus disease. 06/11/2019 MRI CERVICAL/THORACIC SPINE W WO:  1. No evidence of demyelinating lesion within the cervical or thoracic cord.  2. Mild cervical spondylosis at C5-6 and C6-7.  3. No significant degenerative change of the thoracic spine.   Labs: 06/09/2019 Serum:  RF negative, sed rate 7, ds-DNA antibody 1, anti-Smith negative, SSA/SSB antibodies negative, antichromatic antibodies negative, B12 328, vitamin D 28,  06/10/2019 CSF:  Protein 24, glucose 84, Oligoclonal bands 10 (not in serum), IgG index 0.96, Myelin basic protein 3.3, NMO antibody negative, ACE 1.1, cytology negative, gram stain & culture negative 01/05/2020 Serum:  CBC with WBC 4.6, HGB 13.5, HCT 39.9, PLT 243, ALC 1.40; CMP with Na 135, K 5.3, Cl 102, CO2 26, glucose 83, BUN 14, Cr 0.53, t bili 1.3, ALP 47, AST 157, ALT 158; TSH 0.04, free T4 4.68   No family history of MS.  Past DMT:  Vumerity (hives)   PAST MEDICAL HISTORY: Past Medical History:  Diagnosis Date   Autoimmune Addison's disease ([facility])    Hyperthyroidism 2021   MS (multiple sclerosis) ([facility]) 2021    MEDICATIONS: Current Outpatient Medications on File Prior to Visit  Medication Sig Dispense Refill   diazepam (VALIUM) 5 MG tablet Take 1 tablet 30-40 minutes prior to MRI 1 tablet 0   acetaminophen (TYLENOL) 325 MG tablet Take 650 mg by mouth every 6 (six) hours as needed for moderate pain.     fludrocortisone (FLORINEF) 0.1 MG tablet Take 0.1 mg by mouth daily.     hydrocortisone (CORTEF) 10 MG tablet Take 5-15 mg by mouth See admin instructions. Take 15 mg by mouth in the morning between 0600-0800 and 5 mg at 1400     levothyroxine (SYNTHROID) 100 MCG tablet Take 1 tablet (100 mcg total) by mouth daily before breakfast. 30 tablet 2   Multiple Vitamins-Minerals (EMERGEN-C  IMMUNE PO) Take 1 Package by mouth daily.     ZEPOSIA 0.92 MG CAPS TAKE 1 CAPSULE (0.92 MG TOTAL) BY MOUTH IN THE MORNING. 30 capsule 0   No current facility-administered medications on file prior to visit.    ALLERGIES: Allergies  Allergen Reactions   Diroximel Fumarate Rash    Other reaction(s): rash   Methimazole Rash    Other reaction(s): rash   Penicillins Rash    FAMILY HISTORY: Family History  Problem Relation Age of Onset   Alzheimer's disease Paternal Grandfather       Objective:  *** General: No acute distress.  Patient appears well-groomed.   Head:  Normocephalic/atraumatic Eyes:  Fundi examined but not visualized Neck: supple, no paraspinal tenderness, full range of motion Heart:  Regular rate and rhythm Neurological Exam: alert and oriented to person, place, and time.  Speech fluent and not dysarthric, language intact.  CN II-XII intact. Bulk and tone normal, muscle strength 5/5 throughout.  Sensation to temperature and vibration intact.  Deep tendon reflexes 2+ throughout.  Finger to nose testing intact.  Gait normal, Romberg negative.    Shon Millet, DO  CC: Elie Goody, NP

## 2022-12-25 ENCOUNTER — Encounter: Payer: Self-pay | Admitting: Neurology

## 2022-12-25 ENCOUNTER — Other Ambulatory Visit (INDEPENDENT_AMBULATORY_CARE_PROVIDER_SITE_OTHER): Payer: BC Managed Care – PPO

## 2022-12-25 ENCOUNTER — Ambulatory Visit: Payer: BC Managed Care – PPO | Admitting: Neurology

## 2022-12-25 VITALS — BP 114/78 | HR 77 | Ht 64.0 in | Wt 144.2 lb

## 2022-12-25 DIAGNOSIS — G35 Multiple sclerosis: Secondary | ICD-10-CM

## 2022-12-25 LAB — HEPATIC FUNCTION PANEL
ALT: 24 U/L (ref 0–35)
AST: 27 U/L (ref 0–37)
Albumin: 4.3 g/dL (ref 3.5–5.2)
Alkaline Phosphatase: 47 U/L (ref 39–117)
Bilirubin, Direct: 0.1 mg/dL (ref 0.0–0.3)
Total Bilirubin: 0.8 mg/dL (ref 0.2–1.2)
Total Protein: 6.8 g/dL (ref 6.0–8.3)

## 2022-12-25 LAB — VITAMIN D 25 HYDROXY (VIT D DEFICIENCY, FRACTURES): VITD: 27.64 ng/mL — ABNORMAL LOW (ref 30.00–100.00)

## 2022-12-25 NOTE — Progress Notes (Signed)
6 

## 2022-12-25 NOTE — Patient Instructions (Addendum)
Zeposia D3 4000 IU daily Check labs today.  Repeat in 6 months as well (CBC with diff, LFTs, immunoglobulins, vit D) Follow up in 6 months.

## 2022-12-26 LAB — CBC WITH DIFFERENTIAL
Basophils Absolute: 0 10*3/uL (ref 0.0–0.2)
Basos: 1 %
EOS (ABSOLUTE): 0.1 10*3/uL (ref 0.0–0.4)
Eos: 2 %
Hematocrit: 43.3 % (ref 34.0–46.6)
Hemoglobin: 13.7 g/dL (ref 11.1–15.9)
Immature Grans (Abs): 0 10*3/uL (ref 0.0–0.1)
Immature Granulocytes: 0 %
Lymphocytes Absolute: 0.4 10*3/uL — ABNORMAL LOW (ref 0.7–3.1)
Lymphs: 7 %
MCH: 30 pg (ref 26.6–33.0)
MCHC: 31.6 g/dL (ref 31.5–35.7)
MCV: 95 fL (ref 79–97)
Monocytes Absolute: 0.4 10*3/uL (ref 0.1–0.9)
Monocytes: 8 %
Neutrophils Absolute: 4.3 10*3/uL (ref 1.4–7.0)
Neutrophils: 82 %
RBC: 4.57 x10E6/uL (ref 3.77–5.28)
RDW: 12.4 % (ref 11.7–15.4)
WBC: 5.3 10*3/uL (ref 3.4–10.8)

## 2022-12-26 LAB — IGG, IGA, IGM
IgG (Immunoglobin G), Serum: 938 mg/dL (ref 600–1640)
IgM, Serum: 96 mg/dL (ref 50–300)
Immunoglobulin A: 72 mg/dL (ref 47–310)

## 2022-12-27 ENCOUNTER — Telehealth: Payer: Self-pay

## 2022-12-27 ENCOUNTER — Telehealth: Payer: Self-pay | Admitting: Neurology

## 2022-12-27 ENCOUNTER — Other Ambulatory Visit (HOSPITAL_COMMUNITY): Payer: Self-pay

## 2022-12-27 NOTE — Telephone Encounter (Signed)
New message   [facility] calling ZEPOSIA 0.92 MG CAPS prior authorization   Asking for a call back

## 2022-12-27 NOTE — Telephone Encounter (Signed)
Ref Key: [id number], Per covermeds.

## 2022-12-27 NOTE — Telephone Encounter (Signed)
Patient Advocate Encounter   Received notification from [facility] that prior authorization is required for Zeposia 0.92MG  capsules   Submitted: n/a Key [id number]  Currently awaiting clinical questions to generate

## 2022-12-27 NOTE — Telephone Encounter (Signed)
Patient Advocate Encounter   Received notification from [facility] [employer] that prior authorization is required for Zeposia 0.92MG  capsules  Submitted: 12-27-2022 Key [id number]  Status is pending

## 2022-12-27 NOTE — Progress Notes (Signed)
Patient advised of her lab results.

## 2023-01-14 NOTE — Telephone Encounter (Signed)
PA has been approved from 12/28/2022-12/27/2023

## 2023-01-25 ENCOUNTER — Other Ambulatory Visit: Payer: Self-pay | Admitting: Neurology

## 2023-02-21 ENCOUNTER — Other Ambulatory Visit: Payer: Self-pay | Admitting: Neurology

## 2023-06-25 NOTE — Progress Notes (Unsigned)
NEUROLOGY FOLLOW UP OFFICE NOTE  Mary Collins [id number]  Assessment/Plan:   Multiple sclerosis, relapsing-remitting   DMT:  Hassie Bruce Start taking D3 4000 international units daily    Check labs CBC w/diff, LFTs and Vit D today and in 6 months 3.   Follow up 6 months (after repeat tests)  Subjective:  Mary Collins is a 37 year old right-handed Caucasian female with Addison's disease, Graves' disease and anxiety who follows up for multiple sclerosis.     UPDATE: Current DMT:  Zeposia Other:  D3 4000 IU daily.  Sometimes forgets to take it.    12/25/2022 LABS:  CBC with WBC 5.3, HGB 13.7, HCT 43.3; hepatic function panel with t bilii 0.8, ALP 47, AST 27, ALT 24; Vit D 27.64; IgA 72, IgG 38, IgM 96.    No relapses. She has had difficulty controlling her thyroid.  Reports feeling pressure behind both eyes.  Will be following up with ophthalmology. Vision:  No issues Motor:  Normal Sensory:  No issues. Pain:  No pain Gait:  No issues Bowel/Bladder:  No issues. Fatigue:  No Cognition:  No issues Mood:  anxiety.  Under stress.  Has a lot of responsibilities.   No Lhermitte's sign   HISTORY: She was diagnosed with multiple sclerosis in October 2020 after she presented with right optic neuritis.  She had been experiencing blurred vision in her right eye for about a week.  Work-up was performed at [facility].  MRI of brain with and without contrast showed extensive white matter lesions in a configuration consistent with MS as well as slight enlargement and mild enhancement of the right optic nerve.  MRI of cervical and thoracic spine showed no cord lesions. She underwent lumbar puncture where CSF showed elevated oligoclonal bands of 10 and IgG index 0.96. Systemic lupus profile was unremarkable.  She was treated with Solu-Medrol followed by a prednisone taper at discharge, but never followed up with outpatient neurology.   In March 2022, she endorsed eye  pain with decreased visual acuity.  She saw her ophthalmologist who didn't find any concerning abnormalities.  Due to side effects, Vumerity was stopped and she was to start Zeposia.  MRI of brain with and without contrast on 12/25/2020 showed new punctate enhancing lesion in the periventricular white matter adjacent to the left lateral ventricle.  As she had just started Zeposia, no change was yet made to DMT.     She has Addison's disease.  Since she was diagnosed with MS, she has also been diagnosed with Grave's disease.  LFTs were slightly elevated (t bili 1.3, AST/ALTs in the mid 100s), attributed to hyperthyroidism   Ophthalmologic exam from 01/08/2020, including optic nerve/discs, RNFL, and HVF, was unremarkable.   Imaging: 12/20/2022 MRI BRAIN W WO:  Similar sequela of chronic demyelination. No evidence of active demyelination. No new lesions identified. 12/20/2021 MRI BRAIN W/WO:  Patchy FLAIR signal abnormality in the juxtacortical and periventricular white matter consistent with multiple sclerosis is not significantly changed since 2022; however, a lesion in the right frontal lobe white matter is new since the study from 2021.  No enhancement to suggest active demyelination. 12/25/2020 MRI BRAIN W WO:  New punctate focus of enhancement in the periventricular white matter adjacent to the left lateral ventricle. May reflect an area of active demyelination at the site of a chronic demyelinating lesion.  Stable burden of chronic demyelinating lesions.  02/17/2020 MRI BRAIN W WO:  more than 20 non-enhancing T2/FLAIR  lesions within the periventricular and juxtacortical white matter bilaterally with no infratentorial lesions, reportedly unchanged compared with prior imaging from Oct 2020. 06/09/2019 MRI BRAIN W WO:  1.  Extensive foci of T2 signal prolongation in the periventricular deep white matter and pericallosal fibers, likely representing multiple sclerosis.  2.  Slight enlargement of the right  optic nerves and mild enhancement, consistent with right optic neuritis, also likely a manifestation of multiple sclerosis.  3.  No mass, hemorrhage or acute infarct.  4.  Trace chronic anterior ethmoid sinus disease. 06/11/2019 MRI CERVICAL/THORACIC SPINE W WO:  1. No evidence of demyelinating lesion within the cervical or thoracic cord.  2. Mild cervical spondylosis at C5-6 and C6-7.  3. No significant degenerative change of the thoracic spine.   Labs: 06/09/2019 Serum:  RF negative, sed rate 7, ds-DNA antibody 1, anti-Smith negative, SSA/SSB antibodies negative, antichromatic antibodies negative, B12 328, vitamin D 28,  06/10/2019 CSF:  Protein 24, glucose 84, Oligoclonal bands 10 (not in serum), IgG index 0.96, Myelin basic protein 3.3, NMO antibody negative, ACE 1.1, cytology negative, gram stain & culture negative 01/05/2020 Serum:  CBC with WBC 4.6, HGB 13.5, HCT 39.9, PLT 243, ALC 1.40; CMP with Na 135, K 5.3, Cl 102, CO2 26, glucose 83, BUN 14, Cr 0.53, t bili 1.3, ALP 47, AST 157, ALT 158; TSH 0.04, free T4 4.68   No family history of MS.  Past DMT:  Vumerity (hives)   PAST MEDICAL HISTORY: Past Medical History:  Diagnosis Date   Autoimmune Addison's disease ([facility])    Hyperthyroidism 2021   MS (multiple sclerosis) ([facility]) 2021    MEDICATIONS: Current Outpatient Medications on File Prior to Visit  Medication Sig Dispense Refill   acetaminophen (TYLENOL) 325 MG tablet Take 650 mg by mouth every 6 (six) hours as needed for moderate pain.     diazepam (VALIUM) 5 MG tablet Take 1 tablet 30-40 minutes prior to MRI 1 tablet 0   fludrocortisone (FLORINEF) 0.1 MG tablet Take 0.1 mg by mouth daily.     hydrocortisone (CORTEF) 10 MG tablet Take 5-15 mg by mouth See admin instructions. Take 15 mg by mouth in the morning between 0600-0800 and 5 mg at 1400     levothyroxine (SYNTHROID) 100 MCG tablet Take 1 tablet (100 mcg total) by mouth daily before breakfast. 30 tablet 2   Multiple  Vitamins-Minerals (EMERGEN-C IMMUNE PO) Take 1 Package by mouth daily.     Ozanimod HCl (ZEPOSIA) 0.92 MG CAPS TAKE 1 CAPSULE BY MOUTH IN THE MORNING 30 capsule 5   No current facility-administered medications on file prior to visit.    ALLERGIES: Allergies  Allergen Reactions   Diroximel Fumarate Rash    Other reaction(s): rash   Methimazole Rash    Other reaction(s): rash   Penicillins Rash    FAMILY HISTORY: Family History  Problem Relation Age of Onset   Alzheimer's disease Paternal Grandfather       Objective:  Blood pressure 127/84, pulse 68, height 5\' 4"  (1.626 m), weight 143 lb (64.9 kg), SpO2 100%. General: No acute distress.  Patient appears well-groomed.   Head:  Normocephalic/atraumatic Eyes:  Fundi examined but not visualized Neck: supple, no paraspinal tenderness, full range of motion Heart:  Regular rate and rhythm Neurological Exam: alert and oriented.  Speech fluent and not dysarthric, language intact.  CN II-XII intact. Bulk and tone normal, muscle strength 5/5 throughout.  Sensation to pinprick and vibration intact.  Deep tendon reflexes 2+ throughout,  toes downgoin.  Finger to nose testing intact.  Gait normal, Romberg negative.     Shon Millet, DO  CC: Elie Goody, NP

## 2023-06-26 ENCOUNTER — Other Ambulatory Visit: Payer: BC Managed Care – PPO

## 2023-06-26 ENCOUNTER — Ambulatory Visit: Payer: BC Managed Care – PPO | Admitting: Neurology

## 2023-06-26 ENCOUNTER — Encounter: Payer: Self-pay | Admitting: Neurology

## 2023-06-26 VITALS — BP 127/84 | HR 68 | Ht 64.0 in | Wt 143.0 lb

## 2023-06-26 DIAGNOSIS — G35 Multiple sclerosis: Secondary | ICD-10-CM | POA: Diagnosis not present

## 2023-06-26 NOTE — Patient Instructions (Signed)
Zeposia D3 4000 international units daily Check CBC with diff, liver function and vit D.

## 2023-08-15 ENCOUNTER — Other Ambulatory Visit: Payer: Self-pay | Admitting: Neurology

## 2023-09-18 ENCOUNTER — Encounter: Payer: Self-pay | Admitting: Neurology

## 2023-10-28 IMAGING — DX DG CHEST 2V
2 series · 2 of 2 positions shown · non-contrast
Comparison: None.

CLINICAL DATA: Preoperative assessment for thyroidectomy.

EXAM:
CHEST - 2 VIEW

[chest pa]
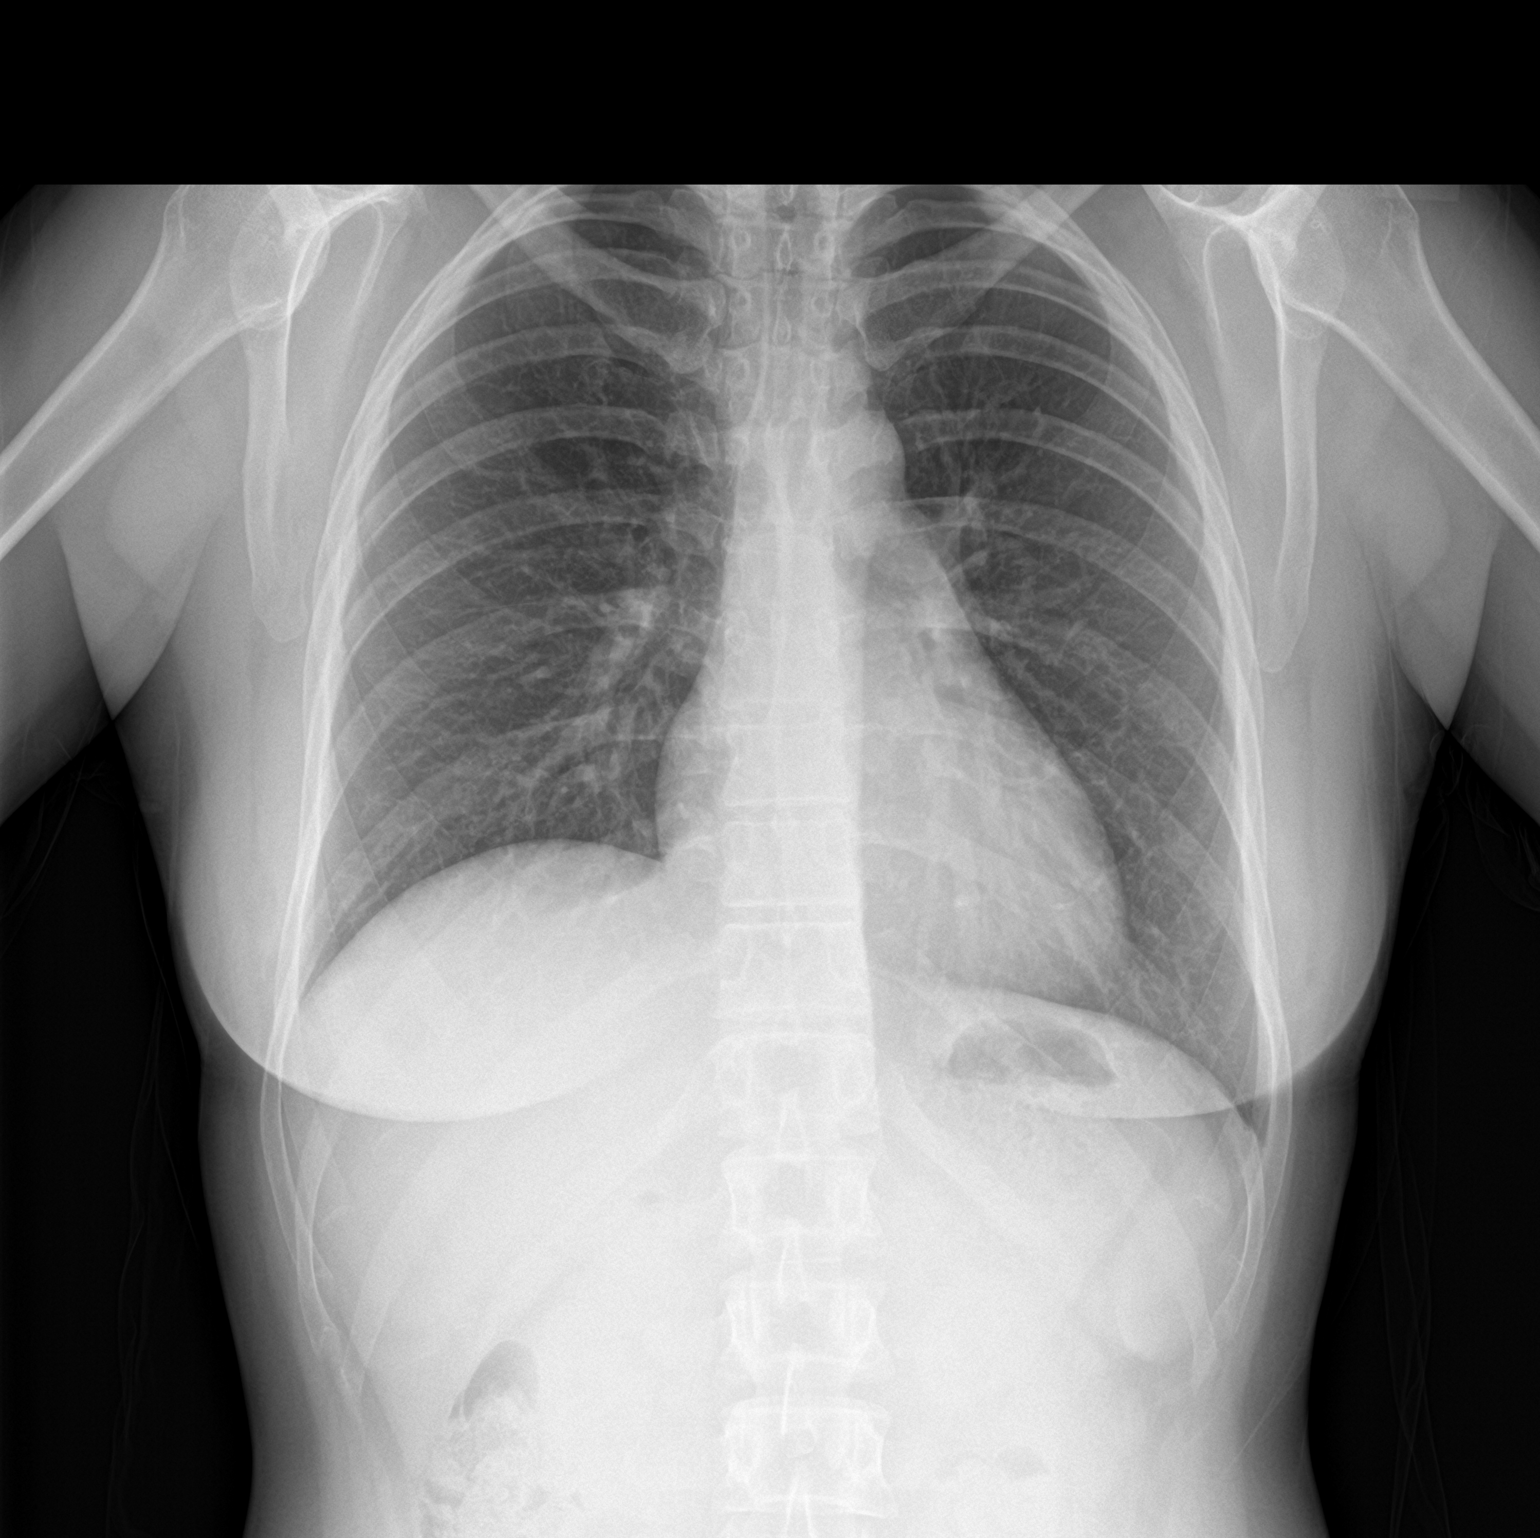

[chest lat]
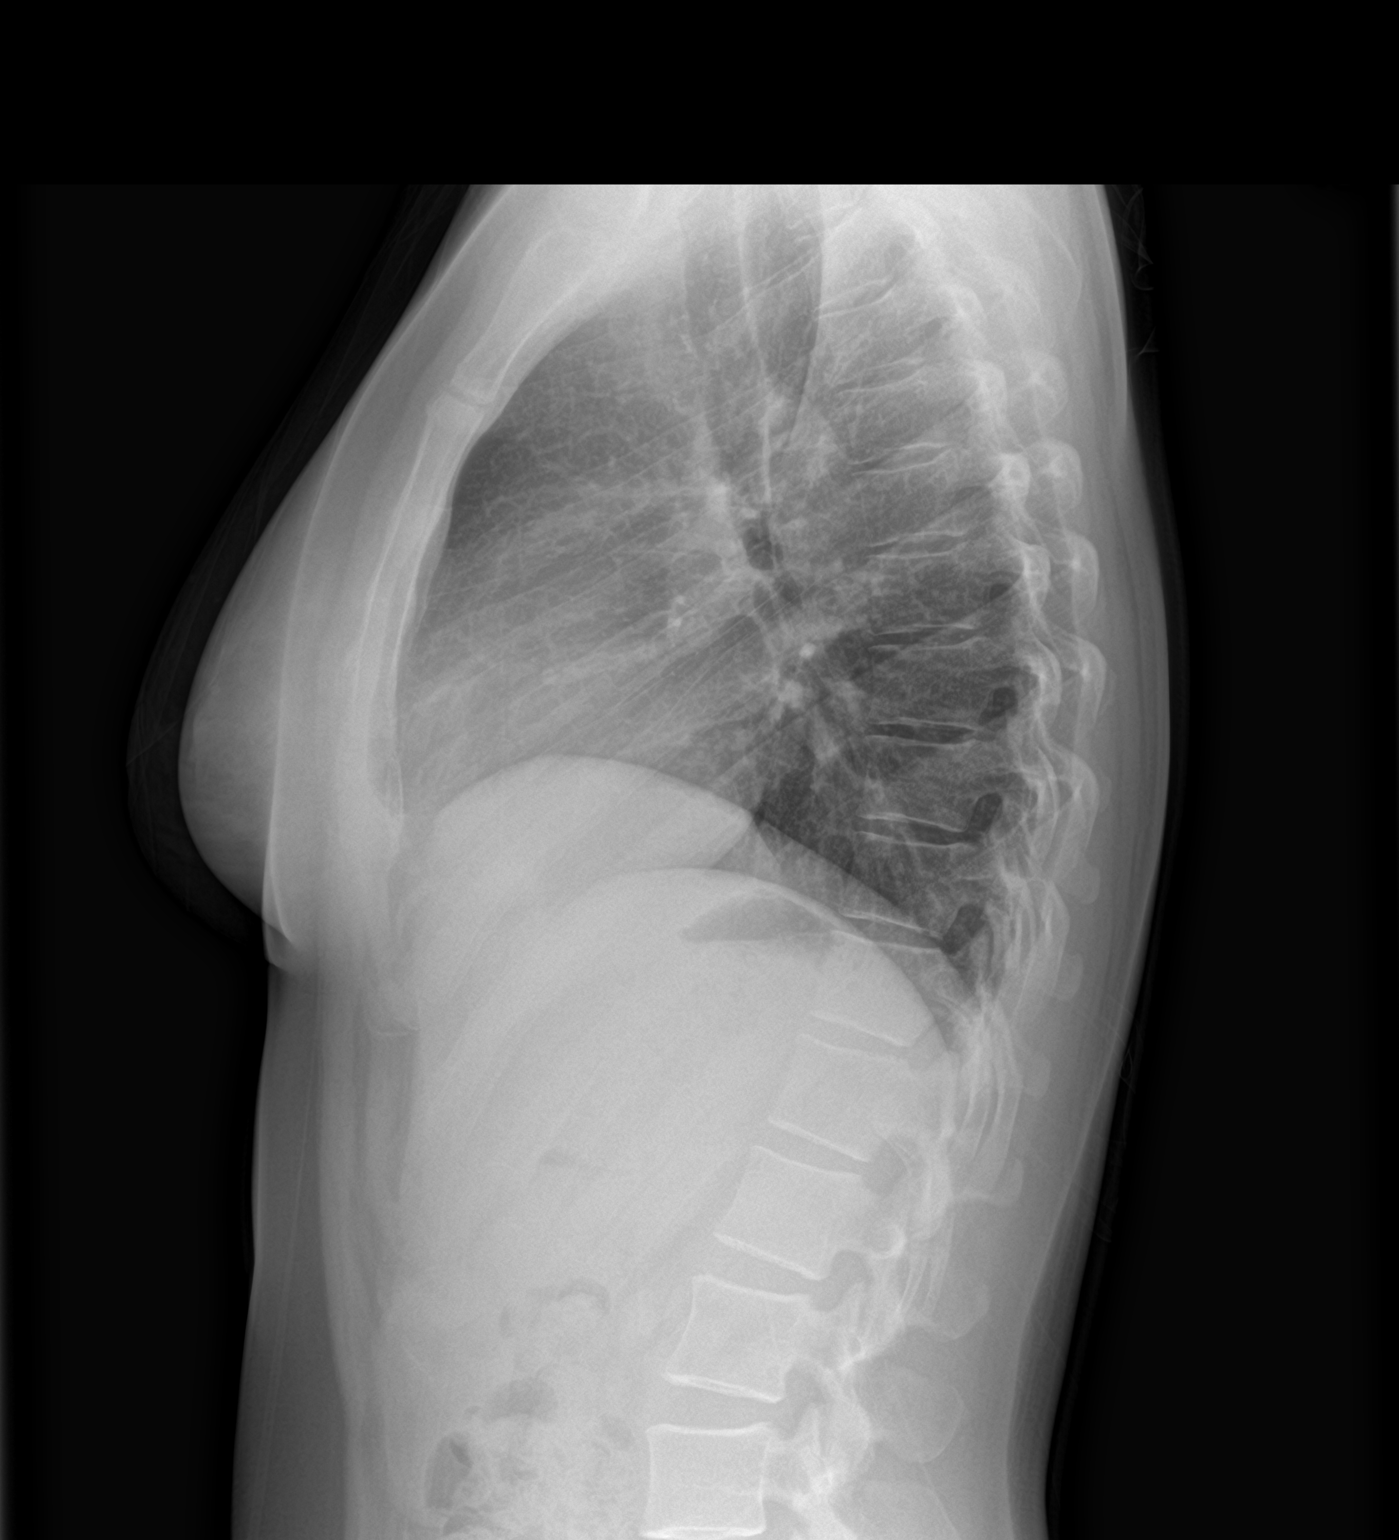

[2 of 2 positions shown; findings below may reference images not displayed]

FINDINGS: The heart size and mediastinal contours are within normal limits.
Both lungs are clear. No acute osseous abnormality.
IMPRESSION: No active cardiopulmonary disease.

## 2023-11-04 ENCOUNTER — Telehealth: Payer: Self-pay | Admitting: Neurology

## 2023-11-04 NOTE — Telephone Encounter (Signed)
 Pt. Out of Rx ZEPOSIA need PA to approve call bck pt

## 2023-11-21 ENCOUNTER — Telehealth: Payer: Self-pay

## 2023-11-21 ENCOUNTER — Other Ambulatory Visit (HOSPITAL_COMMUNITY): Payer: Self-pay

## 2023-11-21 NOTE — Telephone Encounter (Signed)
 PA request has been Submitted. New Encounter has been or will be created for follow up. For additional info see Pharmacy Prior Auth telephone encounter from 03/27.

## 2023-11-21 NOTE — Telephone Encounter (Signed)
*  [facility]  Pharmacy Patient Advocate Encounter   Received notification from CoverMyMeds that prior authorization for Zeposia 0.92MG  capsules  is required/requested.   Insurance verification completed.   The patient is insured through [facility] .   Per test claim: PA required; PA submitted to above mentioned insurance via CoverMyMeds Key/confirmation #/EOC [id number] Status is pending

## 2023-11-22 NOTE — Telephone Encounter (Signed)
 Authorization On File.Authorization On File.Please note that an authorization is already on file for the requested medication and is effective through (12/27/2023). If you would like another copy of the approval letter, please call [phone] option 3, 3. Thank you.

## 2023-12-16 ENCOUNTER — Telehealth: Payer: Self-pay | Admitting: Pharmacy Technician

## 2023-12-16 ENCOUNTER — Other Ambulatory Visit (HOSPITAL_COMMUNITY): Payer: Self-pay

## 2023-12-16 NOTE — Telephone Encounter (Signed)
 Pharmacy Patient Advocate Encounter   Received notification from CoverMyMeds that prior authorization for ZEPOSIA  0.92MG  is required/requested.   Insurance verification completed.   The patient is insured through [facility] .   Per test claim: PA required; PA submitted to above mentioned insurance via CoverMyMeds Key/confirmation #/EOC [id number] Status is pending

## 2023-12-17 NOTE — Telephone Encounter (Signed)
 Pt. Ins called need missing form to authorize Fax:[phone] Phone [phone], informed they have faxed the forms to our office

## 2023-12-18 ENCOUNTER — Other Ambulatory Visit (HOSPITAL_COMMUNITY): Payer: Self-pay

## 2023-12-18 NOTE — Telephone Encounter (Signed)
 Pharmacy Patient Advocate Encounter  Received notification from [facility] that Prior Authorization for ZEPOSIA  0.92MG  has been APPROVED from 4.21.25 to 4.21.26. Ran test claim, Copay is $UNKNOWN. This test claim was processed through [facility]- copay amounts may vary at other pharmacies due to pharmacy/plan contracts, or as the patient moves through the different stages of their insurance plan.   PA #/Case ID/Reference #: [id number]

## 2023-12-18 NOTE — Telephone Encounter (Signed)
 [employer] called in again wanting to let us  know they need a form and it is urgent for them to receive it.

## 2023-12-18 NOTE — Telephone Encounter (Signed)
 Form has been completed and faxed back to [facility] at [phone].

## 2023-12-26 ENCOUNTER — Ambulatory Visit: Payer: BC Managed Care – PPO | Admitting: Neurology

## 2023-12-31 NOTE — Progress Notes (Deleted)
 NEUROLOGY FOLLOW UP OFFICE NOTE  Mary Collins [id number]  Assessment/Plan:   Multiple sclerosis, relapsing-remitting   DMT:  Zeposia  Start taking D3 4000 international units daily    MRI of brain with and without contrast in 6 months *** Check labs LFTs today.  Check CBC with diff, LFTs and vit D in 6 months. 3.   Follow up 6 months (after repeat tests)  Subjective:  Mary Collins is a 38 year old right-handed Caucasian female with Addison's disease, Graves' disease and anxiety who follows up for multiple sclerosis.     UPDATE: Current DMT:  Zeposia  Other:  D3 4000 IU daily.  Sometimes forgets to take it.    09/24/2023 LABS:  CBC with WBC 6.8, HGB 14.3, HCT 42.8, PLT 241, ALC 0.4 06/26/2023 LABS:  Vit D 30.8    No relapses. She has had difficulty controlling her thyroid .  Reports feeling pressure behind both eyes.  Will be following up with ophthalmology. Vision:  No issues Motor:  Normal Sensory:  No issues. Pain:  No pain Gait:  No issues Bowel/Bladder:  No issues. Fatigue:  No Cognition:  No issues Mood:  anxiety.  Under stress.  Has a lot of responsibilities.   No Lhermitte's sign   HISTORY: She was diagnosed with multiple sclerosis in October 2020 after she presented with right optic neuritis.  She had been experiencing blurred vision in her right eye for about a week.  Work-up was performed at [facility].  MRI of brain with and without contrast showed extensive white matter lesions in a configuration consistent with MS as well as slight enlargement and mild enhancement of the right optic nerve.  MRI of cervical and thoracic spine showed no cord lesions. She underwent lumbar puncture where CSF showed elevated oligoclonal bands of 10 and IgG index 0.96. Systemic lupus profile was unremarkable.  She was treated with Solu-Medrol followed by a prednisone taper at discharge, but never followed up with outpatient neurology.   In March 2022, she  endorsed eye pain with decreased visual acuity.  She saw her ophthalmologist who didn't find any concerning abnormalities.  Due to side effects, Vumerity was stopped and she was to start Zeposia .  MRI of brain with and without contrast on 12/25/2020 showed new punctate enhancing lesion in the periventricular white matter adjacent to the left lateral ventricle.  As she had just started Zeposia , no change was yet made to DMT.     She has Addison's disease.  Since she was diagnosed with MS, she has also been diagnosed with Grave's disease.  LFTs were slightly elevated (t bili 1.3, AST/ALTs in the mid 100s), attributed to hyperthyroidism   Ophthalmologic exam from 01/08/2020, including optic nerve/discs, RNFL, and HVF, was unremarkable.   Imaging: 12/20/2022 MRI BRAIN W WO:  Similar sequela of chronic demyelination. No evidence of active demyelination. No new lesions identified. 12/20/2021 MRI BRAIN W/WO:  Patchy FLAIR signal abnormality in the juxtacortical and periventricular white matter consistent with multiple sclerosis is not significantly changed since 2022; however, a lesion in the right frontal lobe white matter is new since the study from 2021.  No enhancement to suggest active demyelination. 12/25/2020 MRI BRAIN W WO:  New punctate focus of enhancement in the periventricular white matter adjacent to the left lateral ventricle. May reflect an area of active demyelination at the site of a chronic demyelinating lesion.  Stable burden of chronic demyelinating lesions.  02/17/2020 MRI BRAIN W WO:  more than 20  non-enhancing T2/FLAIR lesions within the periventricular and juxtacortical white matter bilaterally with no infratentorial lesions, reportedly unchanged compared with prior imaging from Oct 2020. 06/09/2019 MRI BRAIN W WO:  1.  Extensive foci of T2 signal prolongation in the periventricular deep white matter and pericallosal fibers, likely representing multiple sclerosis.  2.  Slight enlargement of  the right optic nerves and mild enhancement, consistent with right optic neuritis, also likely a manifestation of multiple sclerosis.  3.  No mass, hemorrhage or acute infarct.  4.  Trace chronic anterior ethmoid sinus disease. 06/11/2019 MRI CERVICAL/THORACIC SPINE W WO:  1. No evidence of demyelinating lesion within the cervical or thoracic cord.  2. Mild cervical spondylosis at C5-6 and C6-7.  3. No significant degenerative change of the thoracic spine.   Labs: 06/09/2019 Serum:  RF negative, sed rate 7, ds-DNA antibody 1, anti-Smith negative, SSA/SSB antibodies negative, antichromatic antibodies negative, B12 328, vitamin D  28,  06/10/2019 CSF:  Protein 24, glucose 84, Oligoclonal bands 10 (not in serum), IgG index 0.96, Myelin basic protein 3.3, NMO antibody negative, ACE 1.1, cytology negative, gram stain & culture negative 01/05/2020 Serum:  CBC with WBC 4.6, HGB 13.5, HCT 39.9, PLT 243, ALC 1.40; CMP with Na 135, K 5.3, Cl 102, CO2 26, glucose 83, BUN 14, Cr 0.53, t bili 1.3, ALP 47, AST 157, ALT 158; TSH 0.04, free T4 4.68   No family history of MS.  Past DMT:  Vumerity (hives)   PAST MEDICAL HISTORY: Past Medical History:  Diagnosis Date   Autoimmune Addison's disease (HCC)    Hyperthyroidism 2021   MS (multiple sclerosis) (HCC) 2021    MEDICATIONS: Current Outpatient Medications on File Prior to Visit  Medication Sig Dispense Refill   acetaminophen  (TYLENOL ) 325 MG tablet Take 650 mg by mouth every 6 (six) hours as needed for moderate pain.     diazepam  (VALIUM ) 5 MG tablet Take 1 tablet 30-40 minutes prior to MRI 1 tablet 0   fludrocortisone  (FLORINEF ) 0.1 MG tablet Take 0.1 mg by mouth daily.     hydrocortisone  (CORTEF ) 10 MG tablet Take 5-15 mg by mouth See admin instructions. Take 15 mg by mouth in the morning between 0600-0800 and 5 mg at 1400     levothyroxine  (SYNTHROID ) 100 MCG tablet Take 1 tablet (100 mcg total) by mouth daily before breakfast. 30 tablet 2   Multiple  Vitamins-Minerals (EMERGEN-C IMMUNE PO) Take 1 Package by mouth daily.     Ozanimod HCl (ZEPOSIA ) 0.92 MG CAPS TAKE 1 CAPSULE BY MOUTH IN THE MORNING 30 capsule 5   No current facility-administered medications on file prior to visit.    ALLERGIES: Allergies  Allergen Reactions   Diroximel Fumarate Rash    Other reaction(s): rash   Methimazole Rash    Other reaction(s): rash   Penicillins Rash    FAMILY HISTORY: Family History  Problem Relation Age of Onset   Alzheimer's disease Paternal Grandfather       Objective:  *** General: No acute distress.  Patient appears well-groomed.   Head:  Normocephalic/atraumatic Neck:  Supple.  No paraspinal tenderness.  Full range of motion. Heart:  Regular rate and rhythm. Neuro:  alert and oriented.  Speech fluent and not dysarthric, language intact.  CN II-XII intact. Bulk and tone normal, muscle strength 5/5 throughout.  Sensation to pinprick and vibration intact.  Deep tendon reflexes 2+ throughout, toes downgoing.  Finger to nose testing intact.  Gait normal, Romberg negative.  Janne Members, DO  CC: Rudolfo Cosier, NP

## 2024-01-01 ENCOUNTER — Ambulatory Visit: Payer: BC Managed Care – PPO | Admitting: Neurology

## 2024-02-20 ENCOUNTER — Encounter: Payer: Self-pay | Admitting: Neurology

## 2024-02-20 ENCOUNTER — Telehealth: Payer: Self-pay | Admitting: Neurology

## 2024-02-20 NOTE — Telephone Encounter (Signed)
 LMOVM for patient please have your provider send over a clearance form.

## 2024-02-20 NOTE — Telephone Encounter (Signed)
 Pt called in stating she is having a full hysterectomy on 04/10/24 and [facility] [place] wants pre-op clearance for the surgery by Dr. Skeet.

## 2024-02-20 NOTE — Telephone Encounter (Signed)
 Telephone call to [facility] at [phone].  Per Nurse Pam please write a letter or note stating patient is okay or from your opinion from a neurologic stand point to have surgery.    Please fax to [phone].

## 2024-02-20 NOTE — Telephone Encounter (Signed)
 Letter printed and faxed via fax machine and epic

## 2024-03-17 NOTE — Progress Notes (Deleted)
 NEUROLOGY FOLLOW UP OFFICE NOTE  Mary Collins [id number]  Assessment/Plan:   Multiple sclerosis, relapsing-remitting   DMT:  Zeposia  D3 4000 international units daily.  Check vit D level today and adjust dose accordingly. Check ***today. Repeat MRI of brain with and without contrast in 6 months 3.   Follow up 6 months (after repeat tests)  Subjective:  Mary Collins is a 38 year old right-handed Caucasian female with Addison's disease, Graves' disease and anxiety who follows up for multiple sclerosis.     UPDATE: Current DMT:  Zeposia  Other:  D3 4000 IU daily.  Sometimes forgets to take it.    06/26/2023 LABS:  CBC with WBC 5.9, HGB 14.2, HCT 41.3, PLT 282, ALC 0.4; vit D 30.8    No relapses. She has had difficulty controlling her thyroid .  Reports feeling pressure behind both eyes.  Will be following up with ophthalmology. Vision:  No issues Motor:  Normal Sensory:  No issues. Pain:  No pain Gait:  No issues Bowel/Bladder:  No issues. Fatigue:  No Cognition:  No issues Mood:  anxiety.  Under stress.  Has a lot of responsibilities.   No Lhermitte's sign   HISTORY: She was diagnosed with multiple sclerosis in October 2020 after she presented with right optic neuritis.  She had been experiencing blurred vision in her right eye for about a week.  [facility].  MRI of brain with and without contrast showed extensive white matter lesions in a configuration consistent with MS as well as slight enlargement and mild enhancement of the right optic nerve.  MRI of cervical and thoracic spine showed no cord lesions. She underwent lumbar puncture where CSF showed elevated oligoclonal bands of 10 and IgG index 0.96. Systemic lupus profile was unremarkable.  She was treated with Solu-Medrol followed by a prednisone taper at discharge, but never followed up with outpatient neurology.   In March 2022, she endorsed eye pain with decreased  visual acuity.  She saw her ophthalmologist who didn't find any concerning abnormalities.  Due to side effects, Vumerity was stopped and she was to start Zeposia .  MRI of brain with and without contrast on 12/25/2020 showed new punctate enhancing lesion in the periventricular white matter adjacent to the left lateral ventricle.  As she had just started Zeposia , no change was yet made to DMT.     She has Addison's disease.  Since she was diagnosed with MS, she has also been diagnosed with Grave's disease.  LFTs were slightly elevated (t bili 1.3, AST/ALTs in the mid 100s), attributed to hyperthyroidism   Ophthalmologic exam from 01/08/2020, including optic nerve/discs, RNFL, and HVF, was unremarkable.   Imaging: 12/20/2022 MRI BRAIN W WO:  Similar sequela of chronic demyelination. No evidence of active demyelination. No new lesions identified. 12/20/2021 MRI BRAIN W/WO:  Patchy FLAIR signal abnormality in the juxtacortical and periventricular white matter consistent with multiple sclerosis is not significantly changed since 2022; however, a lesion in the right frontal lobe white matter is new since the study from 2021.  No enhancement to suggest active demyelination. 12/25/2020 MRI BRAIN W WO:  New punctate focus of enhancement in the periventricular white matter adjacent to the left lateral ventricle. May reflect an area of active demyelination at the site of a chronic demyelinating lesion.  Stable burden of chronic demyelinating lesions.  02/17/2020 MRI BRAIN W WO:  more than 20 non-enhancing T2/FLAIR lesions within the periventricular and juxtacortical white matter bilaterally with  no infratentorial lesions, reportedly unchanged compared with prior imaging from Oct 2020. 06/09/2019 MRI BRAIN W WO:  1.  Extensive foci of T2 signal prolongation in the periventricular deep white matter and pericallosal fibers, likely representing multiple sclerosis.  2.  Slight enlargement of the right optic nerves and mild  enhancement, consistent with right optic neuritis, also likely a manifestation of multiple sclerosis.  3.  No mass, hemorrhage or acute infarct.  4.  Trace chronic anterior ethmoid sinus disease. 06/11/2019 MRI CERVICAL/THORACIC SPINE W WO:  1. No evidence of demyelinating lesion within the cervical or thoracic cord.  2. Mild cervical spondylosis at C5-6 and C6-7.  3. No significant degenerative change of the thoracic spine.   Labs: 06/09/2019 Serum:  RF negative, sed rate 7, ds-DNA antibody 1, anti-Smith negative, SSA/SSB antibodies negative, antichromatic antibodies negative, B12 328, vitamin D  28,  06/10/2019 CSF:  Protein 24, glucose 84, Oligoclonal bands 10 (not in serum), IgG index 0.96, Myelin basic protein 3.3, NMO antibody negative, ACE 1.1, cytology negative, gram stain & culture negative 01/05/2020 Serum:  CBC with WBC 4.6, HGB 13.5, HCT 39.9, PLT 243, ALC 1.40; CMP with Na 135, K 5.3, Cl 102, CO2 26, glucose 83, BUN 14, Cr 0.53, t bili 1.3, ALP 47, AST 157, ALT 158; TSH 0.04, free T4 4.68   No family history of MS.  Past DMT:  Vumerity (hives)   PAST MEDICAL HISTORY: Past Medical History:  Diagnosis Date   Autoimmune Addison's disease (HCC)    Hyperthyroidism 2021   MS (multiple sclerosis) (HCC) 2021    MEDICATIONS: Current Outpatient Medications on File Prior to Visit  Medication Sig Dispense Refill   acetaminophen  (TYLENOL ) 325 MG tablet Take 650 mg by mouth every 6 (six) hours as needed for moderate pain.     diazepam  (VALIUM ) 5 MG tablet Take 1 tablet 30-40 minutes prior to MRI 1 tablet 0   fludrocortisone  (FLORINEF ) 0.1 MG tablet Take 0.1 mg by mouth daily.     hydrocortisone  (CORTEF ) 10 MG tablet Take 5-15 mg by mouth See admin instructions. Take 15 mg by mouth in the morning between 0600-0800 and 5 mg at 1400     levothyroxine  (SYNTHROID ) 100 MCG tablet Take 1 tablet (100 mcg total) by mouth daily before breakfast. 30 tablet 2   Multiple Vitamins-Minerals (EMERGEN-C  IMMUNE PO) Take 1 Package by mouth daily.     Ozanimod HCl (ZEPOSIA ) 0.92 MG CAPS TAKE 1 CAPSULE BY MOUTH IN THE MORNING 30 capsule 5   No current facility-administered medications on file prior to visit.    ALLERGIES: Allergies  Allergen Reactions   Diroximel Fumarate Rash    Other reaction(s): rash   Methimazole Rash    Other reaction(s): rash   Penicillins Rash    FAMILY HISTORY: Family History  Problem Relation Age of Onset   Alzheimer's disease Paternal Grandfather       Objective:  *** General: No acute distress.  Patient appears well-groomed.   Head:  Normocephalic/atraumatic Neck:  Supple.  No paraspinal tenderness.  Full range of motion. Heart:  Regular rate and rhythm. Neuro:  Alert and oriented.  Speech fluent and not dysarthric.  Language intact.  CN II-XII intact.  Bulk and tone normal.  Muscle strength 5/5 throughout.  Sensation to pinprick and vibration intact.  Deep tendon reflexes 2+ throughout, toes downgoing.  Gait normal.  Romberg negative.       Juliene Dunnings, DO  CC: Rannie Pride, NP

## 2024-03-18 ENCOUNTER — Ambulatory Visit: Admitting: Neurology

## 2024-03-18 ENCOUNTER — Encounter: Payer: Self-pay | Admitting: Neurology

## 2024-03-18 DIAGNOSIS — Z029 Encounter for administrative examinations, unspecified: Secondary | ICD-10-CM

## 2024-03-20 ENCOUNTER — Telehealth: Payer: Self-pay | Admitting: Neurology

## 2024-03-20 NOTE — Telephone Encounter (Signed)
 Pt. Wants to speak with Sheena (no details)

## 2024-03-20 NOTE — Telephone Encounter (Signed)
 Patient no showed for her visit 03/19/24.  LMOVM for patient give the office the call.

## 2024-04-30 ENCOUNTER — Telehealth: Payer: Self-pay | Admitting: Neurology

## 2024-04-30 DIAGNOSIS — R748 Abnormal levels of other serum enzymes: Secondary | ICD-10-CM

## 2024-04-30 NOTE — Telephone Encounter (Signed)
 Per patient she starting to have a high fever, sweats, chills after her Hysterectomy done 8/17 with [facility] Dr. Sherrine.  Patient was seen and an infection was ruled out but she did have E.Coli in her urine.   Patient was seen in the ED on 8/21 with fever of 105 temp labs were drawn her Liver Enzymes were Elevated.  Patient had an follow on 8/22 for the elevated levels.  Patient was seen again by Dr. Sherrine on 9/2.    Advised patient to have records sent over from all providers.

## 2024-04-30 NOTE — Telephone Encounter (Signed)
 Pt had hysterectomy and had labs done and Dr.Gaccione is asking for her to see her neurologist, can we work her in as her next appt is Feb of 2026

## 2024-05-01 NOTE — Telephone Encounter (Signed)
 Per Dr.Jaffe patient to have labs drawn before her appt 10/8.    Per patient she did do labs today as well will have [facility] send them over Monday.

## 2024-05-04 NOTE — Telephone Encounter (Signed)
 Patient advised of Dr.Jaffe note, 'Liver function tests are normal. This suggests it was reactive to something systemic, not the medication. Would still repeat in 4 weeks    Lab order faxed to [facility] at [phone]

## 2024-05-25 ENCOUNTER — Encounter: Payer: Self-pay | Admitting: Neurology

## 2024-05-26 ENCOUNTER — Telehealth: Payer: Self-pay | Admitting: Neurology

## 2024-05-26 NOTE — Telephone Encounter (Signed)
 Pt missed call, calling bk

## 2024-05-26 NOTE — Telephone Encounter (Signed)
 Left a message with after hour service on 05-25-24 at 4:35 pm   Caller states she is returning a call about a email from [facility] stating that she needs to speak with her provider

## 2024-05-27 HISTORY — PX: LAPAROSCOPIC TOTAL HYSTERECTOMY: SUR800

## 2024-05-29 MED ORDER — ZEPOSIA 0.92 MG PO CAPS: 1.0000 | ORAL_CAPSULE | Freq: Every morning | ORAL | 0 refills | Status: DC

## 2024-06-02 NOTE — Progress Notes (Unsigned)
 NEUROLOGY FOLLOW UP OFFICE NOTE  Mary Collins [id number]  Assessment/Plan:   Multiple sclerosis, relapsing-remitting Transaminitis - likely reactive   DMT:  Zeposia  Start taking D3 4000 international units daily    I want to repeat CBC with diff to follow up on absolute lymphocyte count.  *** 3.   Follow up 6 months (after repeat tests)  Subjective:  Mary Collins is a 38 year old right-handed Caucasian female with Addison's disease, Graves' disease and anxiety who follows up for multiple sclerosis.     UPDATE: Current DMT:  Zeposia  Other:  D3 4000 IU daily.  Sometimes forgets to take it.    Last seen a year ago.  Due to missed appointments and not repeating labs, we had stopped prescribing Zeposia .  Her PCP reportedly had continued prescribing it.    She underwent a hysterectomy on 8/17.  Several days later, she developed a fever of 105.  UA did reveal E coli.  Labs on 8/22 revealed hepatic function of elevated ALP 159 and ALT 117 with normal t bili 0.7 and AST 39.  Concern was that the elevated liver enzymes was related to the Zeposia .  Repeat labs on 9/5 revealed normal ALP 78, ALT 22, AST 19 and t bili 0.8.  She had another hepatic function panel on 10/6 which was normal with t bili 1.2, ALP 47, ALT 12 and AST 18.  CBC on 8/22 revealed WBC 6.53, HGB 12.4, HCT 37.8, PLT 308 and ALC 0.1.      No relapses. She has had difficulty controlling her thyroid .  Reports feeling pressure behind both eyes.  Will be following up with ophthalmology. Vision:  No issues Motor:  Normal Sensory:  No issues. Pain:  No pain Gait:  No issues Bowel/Bladder:  No issues. Fatigue:  No Cognition:  No issues Mood:  anxiety.  Under stress.  Has a lot of responsibilities.   No Lhermitte's sign   HISTORY: She was diagnosed with multiple sclerosis in October 2020 after she presented with right optic neuritis.  She had been experiencing blurred vision in her right eye for about a week.  Work-up was  performed at [facility]  MRI of brain with and without contrast showed extensive white matter lesions in a configuration consistent with MS as well as slight enlargement and mild enhancement of the right optic nerve.  MRI of cervical and thoracic spine showed no cord lesions. She underwent lumbar puncture where CSF showed elevated oligoclonal bands of 10 and IgG index 0.96. Systemic lupus profile was unremarkable.  She was treated with Solu-Medrol followed by a prednisone taper at discharge, but never followed up with outpatient neurology.   In March 2022, she endorsed eye pain with decreased visual acuity.  She saw her ophthalmologist who didn't find any concerning abnormalities.  Due to side effects, Vumerity was stopped and she was to start Zeposia .  MRI of brain with and without contrast on 12/25/2020 showed new punctate enhancing lesion in the periventricular white matter adjacent to the left lateral ventricle.  As she had just started Zeposia , no change was yet made to DMT.     She has Addison's disease.  Since she was diagnosed with MS, she has also been diagnosed with Grave's disease.  LFTs were slightly elevated (t bili 1.3, AST/ALTs in the mid 100s), attributed to hyperthyroidism   Ophthalmologic exam from 01/08/2020, including optic nerve/discs, RNFL, and HVF, was unremarkable.   Imaging: 12/20/2022 MRI BRAIN W WO:  Similar  sequela of chronic demyelination. No evidence of active demyelination. No new lesions identified. 12/20/2021 MRI BRAIN W/WO:  Patchy FLAIR signal abnormality in the juxtacortical and periventricular white matter consistent with multiple sclerosis is not significantly changed since 2022; however, a lesion in the right frontal lobe white matter is new since the study from 2021.  No enhancement to suggest active demyelination. 12/25/2020 MRI BRAIN W WO:  New punctate focus of enhancement in the periventricular white matter adjacent to the left lateral  ventricle. May reflect an area of active demyelination at the site of a chronic demyelinating lesion.  Stable burden of chronic demyelinating lesions.  02/17/2020 MRI BRAIN W WO:  more than 20 non-enhancing T2/FLAIR lesions within the periventricular and juxtacortical white matter bilaterally with no infratentorial lesions, reportedly unchanged compared with prior imaging from Oct 2020. 06/09/2019 MRI BRAIN W WO:  1.  Extensive foci of T2 signal prolongation in the periventricular deep white matter and pericallosal fibers, likely representing multiple sclerosis.  2.  Slight enlargement of the right optic nerves and mild enhancement, consistent with right optic neuritis, also likely a manifestation of multiple sclerosis.  3.  No mass, hemorrhage or acute infarct.  4.  Trace chronic anterior ethmoid sinus disease. 06/11/2019 MRI CERVICAL/THORACIC SPINE W WO:  1. No evidence of demyelinating lesion within the cervical or thoracic cord.  2. Mild cervical spondylosis at C5-6 and C6-7.  3. No significant degenerative change of the thoracic spine.   Labs: 06/09/2019 Serum:  RF negative, sed rate 7, ds-DNA antibody 1, anti-Smith negative, SSA/SSB antibodies negative, antichromatic antibodies negative, B12 328, vitamin D  28,  06/10/2019 CSF:  Protein 24, glucose 84, Oligoclonal bands 10 (not in serum), IgG index 0.96, Myelin basic protein 3.3, NMO antibody negative, ACE 1.1, cytology negative, gram stain & culture negative 01/05/2020 Serum:  CBC with WBC 4.6, HGB 13.5, HCT 39.9, PLT 243, ALC 1.40; CMP with Na 135, K 5.3, Cl 102, CO2 26, glucose 83, BUN 14, Cr 0.53, t bili 1.3, ALP 47, AST 157, ALT 158; TSH 0.04, free T4 4.68   No family history of MS.  Past DMT:  Vumerity (hives)   PAST MEDICAL HISTORY: Past Medical History:  Diagnosis Date   Autoimmune Addison's disease (HCC)    Hyperthyroidism 2021   MS (multiple sclerosis) 2021    MEDICATIONS: Current Outpatient Medications on File Prior to Visit   Medication Sig Dispense Refill   acetaminophen  (TYLENOL ) 325 MG tablet Take 650 mg by mouth every 6 (six) hours as needed for moderate pain.     diazepam  (VALIUM ) 5 MG tablet Take 1 tablet 30-40 minutes prior to MRI 1 tablet 0   fludrocortisone  (FLORINEF ) 0.1 MG tablet Take 0.1 mg by mouth daily.     hydrocortisone  (CORTEF ) 10 MG tablet Take 5-15 mg by mouth See admin instructions. Take 15 mg by mouth in the morning between 0600-0800 and 5 mg at 1400     levothyroxine  (SYNTHROID ) 100 MCG tablet Take 1 tablet (100 mcg total) by mouth daily before breakfast. 30 tablet 2   Multiple Vitamins-Minerals (EMERGEN-C IMMUNE PO) Take 1 Package by mouth daily.     Ozanimod HCl (ZEPOSIA ) 0.92 MG CAPS Take 1 capsule (0.92 mg total) by mouth every morning. 30 capsule 0   No current facility-administered medications on file prior to visit.    ALLERGIES: Allergies  Allergen Reactions   Diroximel Fumarate Rash    Other reaction(s): rash   Methimazole Rash    Other reaction(s): rash  Penicillins Rash    FAMILY HISTORY: Family History  Problem Relation Age of Onset   Alzheimer's disease Paternal Grandfather       Objective:  *** General: No acute distress.  Patient appears well-groomed.   Head:  Normocephalic/atraumatic Neck:  Supple.  No paraspinal tenderness.  Full range of motion. Heart:  Regular rate and rhythm. Neuro:  Alert and oriented.  Speech fluent and not dysarthric.  Language intact.  CN II-XII intact.  Bulk and tone normal.  Muscle strength 5/5 throughout.  Sensation to light touch intact.  Deep tendon reflexes 2+ throughout, toes downgoing.  Gait normal.  Romberg negative.      Juliene Dunnings, DO  CC: Rannie Pride, NP

## 2024-06-03 ENCOUNTER — Encounter: Payer: Self-pay | Admitting: Neurology

## 2024-06-03 ENCOUNTER — Other Ambulatory Visit

## 2024-06-03 ENCOUNTER — Ambulatory Visit: Admitting: Neurology

## 2024-06-03 VITALS — BP 112/79 | HR 75 | Ht 64.0 in | Wt 141.0 lb

## 2024-06-03 DIAGNOSIS — G35A Relapsing-remitting multiple sclerosis: Secondary | ICD-10-CM | POA: Diagnosis not present

## 2024-06-03 DIAGNOSIS — E559 Vitamin D deficiency, unspecified: Secondary | ICD-10-CM

## 2024-06-03 LAB — CBC WITH DIFFERENTIAL/PLATELET
Absolute Lymphocytes: 341 {cells}/uL — ABNORMAL LOW (ref 850–3900)
Absolute Monocytes: 363 {cells}/uL (ref 200–950)
Basophils Absolute: 39 {cells}/uL (ref 0–200)
Basophils Relative: 0.7 %
Eosinophils Absolute: 28 {cells}/uL (ref 15–500)
Eosinophils Relative: 0.5 %
HCT: 40.5 % (ref 35.0–45.0)
Hemoglobin: 13.4 g/dL (ref 11.7–15.5)
MCH: 30.9 pg (ref 27.0–33.0)
MCHC: 33.1 g/dL (ref 32.0–36.0)
MCV: 93.3 fL (ref 80.0–100.0)
MPV: 11.6 fL (ref 7.5–12.5)
Monocytes Relative: 6.6 %
Neutro Abs: 4730 {cells}/uL (ref 1500–7800)
Neutrophils Relative %: 86 %
Platelets: 301 Thousand/uL (ref 140–400)
RBC: 4.34 Million/uL (ref 3.80–5.10)
RDW: 12.7 % (ref 11.0–15.0)
Total Lymphocyte: 6.2 %
WBC: 5.5 Thousand/uL (ref 3.8–10.8)

## 2024-06-03 LAB — ADVANCED WRITTEN NOTIFICATION (AWN) TEST REFUSAL: AWN TEST REFUSED: 17306

## 2024-06-03 MED ORDER — ZEPOSIA 0.92 MG PO CAPS: 1.0000 | ORAL_CAPSULE | Freq: Every morning | ORAL | 5 refills | Status: DC

## 2024-06-03 NOTE — Patient Instructions (Signed)
 Zeposia  D3 5000 I U daily Check CBC with diff and vit D today Check CBC with diff, hepatic panel, and vit D in 6 months Check MRI of brain with and without contrast in 6 months Follow up with me in 6 months (after repeat testing)

## 2024-06-03 NOTE — Addendum Note (Signed)
 Addended by: OZELL JESUSA PARAS on: 06/03/2024 02:41 PM   Modules accepted: Orders

## 2024-06-03 NOTE — Addendum Note (Signed)
 Addended by: OZELL JESUSA PARAS on: 06/03/2024 02:15 PM   Modules accepted: Orders

## 2024-06-04 ENCOUNTER — Encounter: Payer: Self-pay | Admitting: Neurology

## 2024-06-04 ENCOUNTER — Other Ambulatory Visit: Payer: Self-pay | Admitting: Neurology

## 2024-06-04 ENCOUNTER — Ambulatory Visit: Payer: Self-pay | Admitting: Neurology

## 2024-06-04 LAB — VITAMIN D 25 HYDROXY (VIT D DEFICIENCY, FRACTURES): Vit D, 25-Hydroxy: 37 ng/mL (ref 30–100)

## 2024-06-04 MED ORDER — CHOLECALCIFEROL 1.25 MG (50000 UT) PO CAPS: 50000.0000 [IU] | ORAL_CAPSULE | ORAL | 5 refills | Status: AC

## 2024-06-24 ENCOUNTER — Encounter: Payer: Self-pay | Admitting: Neurology

## 2024-06-25 ENCOUNTER — Encounter: Payer: Self-pay | Admitting: Neurology

## 2024-06-26 MED ORDER — DIAZEPAM 5 MG PO TABS: ORAL_TABLET | ORAL | 0 refills | Status: DC

## 2024-06-28 ENCOUNTER — Ambulatory Visit
Admission: RE | Admit: 2024-06-28 | Discharge: 2024-06-28 | Disposition: A | Source: Ambulatory Visit | Attending: Neurology

## 2024-06-28 DIAGNOSIS — G35A Relapsing-remitting multiple sclerosis: Secondary | ICD-10-CM

## 2024-06-28 MED ORDER — GADOPICLENOL 0.5 MMOL/ML IV SOLN
6.5000 mL | Freq: Once | INTRAVENOUS | Status: AC | PRN
Start: 2024-06-28 — End: 2024-06-28
  Administered 2024-06-28: 6.5 mL via INTRAVENOUS

## 2024-07-03 ENCOUNTER — Telehealth: Payer: Self-pay | Admitting: Neurology

## 2024-07-03 NOTE — Progress Notes (Signed)
 Patient advised.

## 2024-07-03 NOTE — Telephone Encounter (Signed)
 See results notes.

## 2024-07-03 NOTE — Telephone Encounter (Signed)
 Pt is returning a call. Please call back. Thanks

## 2024-07-10 ENCOUNTER — Ambulatory Visit (INDEPENDENT_AMBULATORY_CARE_PROVIDER_SITE_OTHER): Admitting: Family Medicine

## 2024-07-10 ENCOUNTER — Encounter: Payer: Self-pay | Admitting: Family Medicine

## 2024-07-10 VITALS — BP 110/80 | HR 84 | Temp 98.1°F | Ht 64.0 in | Wt 145.0 lb

## 2024-07-10 DIAGNOSIS — J329 Chronic sinusitis, unspecified: Secondary | ICD-10-CM

## 2024-07-10 DIAGNOSIS — R6889 Other general symptoms and signs: Secondary | ICD-10-CM | POA: Diagnosis not present

## 2024-07-10 DIAGNOSIS — B9689 Other specified bacterial agents as the cause of diseases classified elsewhere: Secondary | ICD-10-CM | POA: Diagnosis not present

## 2024-07-10 LAB — POC COVID19 BINAXNOW: SARS Coronavirus 2 Ag: NEGATIVE

## 2024-07-10 LAB — POCT FLU A/B STATUS
Influenza A, POC: NEGATIVE
Influenza B, POC: NEGATIVE

## 2024-07-10 MED ORDER — AZITHROMYCIN 250 MG PO TABS: ORAL_TABLET | ORAL | 0 refills | Status: DC

## 2024-07-10 NOTE — Progress Notes (Signed)
 Acute Office Visit  Subjective:    Patient ID: Mary Collins, female    DOB: [birth year], 38 y.o.   MRN: [id number]  Chief Complaint  Patient presents with   URI   Discussed the use of AI scribe software for clinical note transcription with the patient, who gave verbal consent to proceed.  History of Present Illness Mary Collins is a 38 year old female with Addison's disease and multiple sclerosis who presents with upper respiratory symptoms.  Upper respiratory symptoms - Scratchy throat began late yesterday evening, non-painful, causing frequent throat clearing - Cough and sensation of needing to clear throat developed this morning after arriving at work - Progression to runny and stuffy nose - Persistent cough described as 'can't get rid of' - No headache, fever, chills, or ear pain - No history of asthma  Recent infectious exposure - Exposure to individuals who tested positive for covid 19 earlier in the week - Tested today for covid 19 was negative.  Immunosuppression and chronic disease management - Addison's disease managed with hydrocortisone  and fludrocortisone  - Multiple sclerosis managed with ozanimod (Zeposia ) - Hypothyroidism managed with levothyroxine  137 mcg daily - Vitamin D  taken weekly - Ibuprofen used as needed  Medication allergies - Allergic to penicillin - Typically uses Keflex or Z-Pak for infections  Substance use - Vapes socially, primarily on weekends when drinking - Working on quitting vaping - No cigarette smoking    Past Medical History:  Diagnosis Date   Autoimmune Addison's disease (HCC)    Hyperthyroidism 2021   MS (multiple sclerosis) 2021    Past Surgical History:  Procedure Laterality Date   CESAREAN SECTION  2021   THYROIDECTOMY N/A 11/17/2021   Procedure: TOTAL THYROIDECTOMY;  Surgeon: Eletha Boas, MD;  Location: [facility];  Service: General;  Laterality: N/A;    Family History  Problem Relation Age of Onset   Alzheimer's  disease Paternal Grandfather     Social History   Socioeconomic History   Marital status: Married    Spouse name: Not on file   Number of children: Not on file   Years of education: Not on file   Highest education level: Not on file  Occupational History   Not on file  Tobacco Use   Smoking status: Former    Current packs/day: 0.00    Types: Cigarettes    Quit date: 06/16/2019    Years since quitting: 5.0   Smokeless tobacco: Never  Vaping Use   Vaping status: Every Day   Substances: Nicotine, Flavoring  Substance and Sexual Activity   Alcohol use: Yes    Comment: BEER OR WINE ON OCCAS   Drug use: Not Currently   Sexual activity: Not on file  Other Topics Concern   Not on file  Social History Narrative   Right hnaded   Lives with husband and child in one story home   Social Drivers of Corporate Investment Banker Strain: Not on file  Food Insecurity: Not on file  Transportation Needs: Not on file  Physical Activity: Not on file  Stress: Not on file  Social Connections: Not on file  Intimate Partner Violence: Not on file    Outpatient Medications Prior to Visit  Medication Sig Dispense Refill   acetaminophen  (TYLENOL ) 325 MG tablet Take 650 mg by mouth every 6 (six) hours as needed for moderate pain.     Cholecalciferol 1.25 MG (50000 UT) capsule Take 1 capsule (50,000 Units total) by mouth every 7 (seven) days.  5 capsule 5   Ferrous Sulfate (IRON PO) Take 1 tablet by mouth daily.     fludrocortisone  (FLORINEF ) 0.1 MG tablet Take 0.1 mg by mouth daily.     hydrocortisone  (CORTEF ) 10 MG tablet Take 5-15 mg by mouth See admin instructions. Take 15 mg by mouth in the morning between 0600-0800 and 5 mg at 1400     ibuprofen (ADVIL) 800 MG tablet Take 800 mg by mouth every 8 (eight) hours as needed.     levothyroxine  (SYNTHROID ) 137 MCG tablet Take 137 mcg by mouth daily before breakfast.     Multiple Vitamins-Minerals (EMERGEN-C IMMUNE PO) Take 1 Package by mouth  daily.     NON FORMULARY Pure: Thyorid Support     Ozanimod HCl (ZEPOSIA ) 0.92 MG CAPS Take 1 capsule (0.92 mg total) by mouth every morning. 30 capsule 5   diazepam  (VALIUM ) 5 MG tablet Take 1 tablet 60 minutes prior to MRI; may repeat 15 min prior if needed 2 tablet 0   levothyroxine  (SYNTHROID ) 100 MCG tablet Take 1 tablet (100 mcg total) by mouth daily before breakfast. 30 tablet 2   No facility-administered medications prior to visit.    Allergies  Allergen Reactions   Diroximel Fumarate Rash    Other reaction(s): rash   Methimazole Rash    Other reaction(s): rash   Penicillins Rash    Review of Systems  Constitutional:  Negative for chills, fatigue and fever.  HENT:  Positive for congestion, sore throat and voice change. Negative for ear pain.   Respiratory:  Positive for cough. Negative for shortness of breath.   Cardiovascular:  Negative for chest pain.       Objective:        07/10/2024   10:51 AM 06/03/2024    1:26 PM 06/26/2023   10:36 AM  Vitals with BMI  Height 5' 4 5' 4 5' 4  Weight 145 lbs 141 lbs 143 lbs  BMI 24.88 24.19 24.53  Systolic 110 112 872  Diastolic 80 79 84  Pulse 84 75 68    No data found.   Physical Exam Vitals reviewed.  Constitutional:      Appearance: Normal appearance.  HENT:     Right Ear: Tympanic membrane, ear canal and external ear normal.     Left Ear: Tympanic membrane, ear canal and external ear normal.     Nose: Nose normal.     Right Turbinates: Enlarged and swollen.     Left Turbinates: Enlarged and swollen.     Comments: Erythematous BL nostrils    Mouth/Throat:     Pharynx: Oropharynx is clear.  Cardiovascular:     Rate and Rhythm: Normal rate and regular rhythm.     Heart sounds: Normal heart sounds. No murmur heard. Pulmonary:     Effort: Pulmonary effort is normal. No respiratory distress.     Breath sounds: Normal breath sounds.  Lymphadenopathy:     Cervical: No cervical adenopathy.  Neurological:      Mental Status: She is alert and oriented to person, place, and time.  Psychiatric:        Mood and Affect: Mood normal.        Behavior: Behavior normal.     Health Maintenance Due  Topic Date Due   COVID-19 Vaccine (1) Never done   HIV Screening  Never done   Hepatitis C Screening  Never done   DTaP/Tdap/Td (1 - Tdap) Never done   Hepatitis B Vaccines 19-59 Average Risk (  1 of 3 - 19+ 3-dose series) Never done   HPV VACCINES (1 - 3-dose SCDM series) Never done   Influenza Vaccine  Never done       Topic Date Due   Hepatitis B Vaccines 19-59 Average Risk (1 of 3 - 19+ 3-dose series) Never done   HPV VACCINES (1 - 3-dose SCDM series) Never done     No results found for: TSH Lab Results  Component Value Date   WBC 5.5 06/03/2024   HGB 13.4 06/03/2024   HCT 40.5 06/03/2024   MCV 93.3 06/03/2024   PLT 301 06/03/2024   Lab Results  Component Value Date   NA 133 (L) 06/25/2022   K 4.4 06/25/2022   CO2 23 06/25/2022   GLUCOSE 88 06/25/2022   BUN 20 06/25/2022   CREATININE 0.92 06/25/2022   BILITOT 0.8 12/25/2022   ALKPHOS 47 12/25/2022   AST 27 12/25/2022   ALT 24 12/25/2022   PROT 6.8 12/25/2022   ALBUMIN 4.3 12/25/2022   CALCIUM  9.0 06/25/2022   ANIONGAP 5 11/18/2021   GFR 79.77 06/25/2022   No results found for: CHOL No results found for: HDL No results found for: LDLCALC No results found for: TRIG No results found for: CHOLHDL No results found for: YHAJ8R      Results for orders placed or performed in visit on 07/10/24  POC COVID-19   Collection Time: 07/10/24 10:10 AM  Result Value Ref Range   SARS Coronavirus 2 Ag Negative Negative  POCT Flu A & B Status   Collection Time: 07/10/24 10:10 AM  Result Value Ref Range   Influenza A, POC Negative Negative   Influenza B, POC Negative Negative     Assessment & Plan:   Assessment & Plan Flu-like symptoms Likely viral etiology. - Prescribed azithromycin (Z-Pak). - Advised monitoring  symptoms for 24 hours before starting antibiotics. - Discussed COVID-19 testing and masking protocols if symptoms worsen or COVID-19 is suspected. Neg testing. Orders:   POC COVID-19   POCT Flu A & B Status  Bacterial sinusitis Zpack.start tomorrow if not improving   Orders:   azithromycin (ZITHROMAX Z-PAK) 250 MG tablet; 2 DAILY FOR FIRST DAY, THEN DECREASE TO ONE DAILY FOR 4 MORE DAYS.     Body mass index is 24.89 kg/m..   Meds ordered this encounter  Medications   azithromycin (ZITHROMAX Z-PAK) 250 MG tablet    Sig: 2 DAILY FOR FIRST DAY, THEN DECREASE TO ONE DAILY FOR 4 MORE DAYS.    Dispense:  6 each    Refill:  0    Orders Placed This Encounter  Procedures   POC COVID-19   POCT Flu A & B Status     Follow-up: Return if symptoms worsen or fail to improve.  An After Visit Summary was printed and given to the patient.  Abigail Free, MD [facility] [phone]

## 2024-07-13 ENCOUNTER — Ambulatory Visit: Admitting: Neurology

## 2024-07-21 ENCOUNTER — Other Ambulatory Visit: Payer: Self-pay | Admitting: Neurology

## 2024-08-31 ENCOUNTER — Encounter: Payer: Self-pay | Admitting: Neurology

## 2024-09-01 MED ORDER — ZEPOSIA 0.92 MG PO CAPS: 1.0000 | ORAL_CAPSULE | Freq: Every morning | ORAL | 5 refills | Status: DC

## 2024-09-01 NOTE — Telephone Encounter (Signed)
 Per patient she has new insurance that requires her to use Copy.    Script sent to [employer].

## 2024-09-02 ENCOUNTER — Telehealth: Payer: Self-pay | Admitting: Neurology

## 2024-09-02 NOTE — Telephone Encounter (Signed)
 Pt called this afternoon and she wants a return call back. Thanks

## 2024-09-03 ENCOUNTER — Telehealth: Payer: Self-pay | Admitting: Pharmacy Technician

## 2024-09-03 ENCOUNTER — Other Ambulatory Visit: Payer: Self-pay

## 2024-09-03 ENCOUNTER — Other Ambulatory Visit (HOSPITAL_COMMUNITY): Payer: Self-pay

## 2024-09-03 NOTE — Telephone Encounter (Signed)
 Pharmacy Patient Advocate Encounter   Received notification from Pt Calls Messages that prior authorization for ZEPOSIA  0.92MG  is required/requested.   Insurance verification completed.   The patient is insured through [facility].   Per test claim: PA required; PA submitted to above mentioned insurance via Latent Key/confirmation #/EOC [id number] Status is pending

## 2024-09-03 NOTE — Telephone Encounter (Signed)
 See my chart message

## 2024-09-04 ENCOUNTER — Ambulatory Visit (INDEPENDENT_AMBULATORY_CARE_PROVIDER_SITE_OTHER): Admitting: Family Medicine

## 2024-09-04 ENCOUNTER — Telehealth: Payer: Self-pay | Admitting: Neurology

## 2024-09-04 ENCOUNTER — Ambulatory Visit: Payer: Self-pay | Admitting: Family Medicine

## 2024-09-04 ENCOUNTER — Encounter: Payer: Self-pay | Admitting: Family Medicine

## 2024-09-04 VITALS — BP 118/64 | HR 81 | Temp 98.1°F | Ht 64.0 in | Wt 143.3 lb

## 2024-09-04 DIAGNOSIS — E271 Primary adrenocortical insufficiency: Secondary | ICD-10-CM

## 2024-09-04 DIAGNOSIS — E890 Postprocedural hypothyroidism: Secondary | ICD-10-CM | POA: Diagnosis not present

## 2024-09-04 DIAGNOSIS — G35D Multiple sclerosis, unspecified: Secondary | ICD-10-CM

## 2024-09-04 DIAGNOSIS — E559 Vitamin D deficiency, unspecified: Secondary | ICD-10-CM | POA: Diagnosis not present

## 2024-09-04 DIAGNOSIS — Z90710 Acquired absence of both cervix and uterus: Secondary | ICD-10-CM

## 2024-09-04 DIAGNOSIS — G4489 Other headache syndrome: Secondary | ICD-10-CM

## 2024-09-04 LAB — COMPREHENSIVE METABOLIC PANEL WITH GFR
ALT: 24 IU/L (ref 0–32)
AST: 26 IU/L (ref 0–40)
Albumin: 4.4 g/dL (ref 3.9–4.9)
Alkaline Phosphatase: 61 IU/L (ref 41–116)
BUN/Creatinine Ratio: 14 (ref 9–23)
BUN: 14 mg/dL (ref 6–20)
Bilirubin Total: 1.2 mg/dL (ref 0.0–1.2)
CO2: 25 mmol/L (ref 20–29)
Calcium: 9.3 mg/dL (ref 8.7–10.2)
Chloride: 104 mmol/L (ref 96–106)
Creatinine, Ser: 1.01 mg/dL — ABNORMAL HIGH (ref 0.57–1.00)
Globulin, Total: 1.8 g/dL (ref 1.5–4.5)
Glucose: 98 mg/dL (ref 70–99)
Potassium: 4.3 mmol/L (ref 3.5–5.2)
Sodium: 141 mmol/L (ref 134–144)
Total Protein: 6.2 g/dL (ref 6.0–8.5)
eGFR: 73 mL/min/1.73

## 2024-09-04 LAB — CBC WITH DIFFERENTIAL/PLATELET
Basophils Absolute: 0 x10E3/uL (ref 0.0–0.2)
Basos: 1 %
EOS (ABSOLUTE): 0 x10E3/uL (ref 0.0–0.4)
Eos: 1 %
Hematocrit: 40.5 % (ref 34.0–46.6)
Hemoglobin: 14 g/dL (ref 11.1–15.9)
Immature Grans (Abs): 0 x10E3/uL (ref 0.0–0.1)
Immature Granulocytes: 0 %
Lymphocytes Absolute: 0.3 x10E3/uL — ABNORMAL LOW (ref 0.7–3.1)
Lymphs: 5 %
MCH: 31.9 pg (ref 26.6–33.0)
MCHC: 34.6 g/dL (ref 31.5–35.7)
MCV: 92 fL (ref 79–97)
Monocytes Absolute: 0.4 x10E3/uL (ref 0.1–0.9)
Monocytes: 7 %
Neutrophils Absolute: 4.8 x10E3/uL (ref 1.4–7.0)
Neutrophils: 86 %
Platelets: 253 x10E3/uL (ref 150–450)
RBC: 4.39 x10E6/uL (ref 3.77–5.28)
RDW: 12.1 % (ref 11.7–15.4)
WBC: 5.5 x10E3/uL (ref 3.4–10.8)

## 2024-09-04 LAB — C-REACTIVE PROTEIN: CRP: 3 mg/L (ref 0–10)

## 2024-09-04 LAB — SEDIMENTATION RATE: Sed Rate: 2 mm/h (ref 0–32)

## 2024-09-04 MED ORDER — KETOROLAC TROMETHAMINE 60 MG/2ML IM SOLN
60.0000 mg | Freq: Once | INTRAMUSCULAR | Status: AC
  Administered 2024-09-04: 60 mg via INTRAMUSCULAR

## 2024-09-04 NOTE — Progress Notes (Unsigned)
 "  Acute Office Visit  Subjective:    Patient ID: Mary Collins, female    DOB: May 02, 1986, 39 y.o.   MRN: [id number]  Chief Complaint  Patient presents with   Headache    Primarily right eye.    HPI: Patient is in today for Headache.  Discussed the use of AI scribe software for clinical note transcription with the patient, who gave verbal consent to proceed.  History of Present Illness Mary Collins is a 39 year old female with multiple sclerosis who presents with persistent headaches.  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 - Vision disturbances in the right eye, including tunnel vision similar to looking into a bright light - Visual symptoms eventually resolve spontaneously - No prior treatment with prednisone for these symptoms  Multiple sclerosis symptoms - No numbness, tingling, or weakness  Endocrine disorders - History of Graves' disease, status post thyroidectomy, currently taking levothyroxine  - 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 coli, followed by yeast infection - Treated with antibiotics for sepsis at that time  Vitamin d  deficiency - Currently on prescription vitamin D  50,000 units due to previously low levels - Initially took over-the-counter vitamin D  - Missed doses for two weeks but has resumed regimen  Constitutional and respiratory symptoms - No fevers, congestion, sore  throat, earaches, chest pain, or breathing problems    Past Medical History:  Diagnosis Date   Autoimmune Addison's disease (HCC)    Hyperthyroidism 2021   MS (multiple sclerosis) 2021   Other headache syndrome 09/05/2024    Past Surgical History:  Procedure Laterality Date   CESAREAN SECTION  2021   LAPAROSCOPIC TOTAL HYSTERECTOMY  05/2024   THYROIDECTOMY N/A 11/17/2021   Procedure: TOTAL THYROIDECTOMY;  Surgeon: Eletha Boas, MD;  Location: [facility];  Service: General;  Laterality: N/A;    Family History  Problem Relation Age of Onset   Alzheimer's disease Paternal Grandfather     Social History   Socioeconomic History   Marital status: Married    Spouse name: Not on file   Number of children: Not on file   Years of education: Not on file   Highest education level: Not on file  Occupational History   Not on file  Tobacco Use   Smoking status: Former    Current packs/day: 0.00    Types: Cigarettes    Quit date: 06/16/2019    Years since quitting: 5.2   Smokeless tobacco: Never  Vaping Use   Vaping status: Every Day   Substances: Nicotine, Flavoring  Substance and Sexual Activity   Alcohol use: Yes    Comment: BEER OR WINE ON OCCAS   Drug use: Not Currently   Sexual activity: Not on file  Other Topics Concern   Not on file  Social History Narrative   Right hnaded   Lives with husband and child in one story home   Social Drivers of Health   Tobacco Use: Medium Risk (09/04/2024)   Patient History    Smoking Tobacco Use:  Former    Smokeless Tobacco Use: Never    Passive Exposure: Not on Actuary Strain: Low Risk (09/04/2024)   Overall Financial Resource Strain (CARDIA)    Difficulty of Paying Living Expenses: Not hard at all  Food Insecurity: No Food Insecurity (09/04/2024)   Epic    Worried About Programme Researcher, Broadcasting/film/video in the Last Year: Never true    Ran Out of Food in the Last Year: Never true  Transportation Needs: No Transportation Needs  (09/04/2024)   Epic    Lack of Transportation (Medical): No    Lack of Transportation (Non-Medical): No  Physical Activity: Insufficiently Active (09/04/2024)   Exercise Vital Sign    Days of Exercise per Week: 5 days    Minutes of Exercise per Session: 20 min  Stress: No Stress Concern Present (09/04/2024)   [employer] of Occupational Health - Occupational Stress Questionnaire    Feeling of Stress: Not at all  Social Connections: Moderately Isolated (09/04/2024)   Social Connection and Isolation Panel    Frequency of Communication with Friends and Family: More than three times a week    Frequency of Social Gatherings with Friends and Family: More than three times a week    Attends Religious Services: Never    Database Administrator or Organizations: No    Attends Banker Meetings: Never    Marital Status: Married  Catering Manager Violence: Not At Risk (09/04/2024)   Epic    Fear of Current or Ex-Partner: No    Emotionally Abused: No    Physically Abused: No    Sexually Abused: No  Depression (PHQ2-9): Low Risk (09/04/2024)   Depression (PHQ2-9)    PHQ-2 Score: 0  Alcohol Screen: Low Risk (09/04/2024)   Alcohol Screen    Last Alcohol Screening Score (AUDIT): 2  Housing: Low Risk (09/04/2024)   Epic    Unable to Pay for Housing in the Last Year: No    Number of Times Moved in the Last Year: 0    Homeless in the Last Year: No  Utilities: Not At Risk (09/04/2024)   Epic    Threatened with loss of utilities: No  Health Literacy: Adequate Health Literacy (09/04/2024)   B1300 Health Literacy    Frequency of need for help with medical instructions: Never    Outpatient Medications Prior to Visit  Medication Sig Dispense Refill   Cholecalciferol  1.25 MG (50000 UT) capsule Take 1 capsule (50,000 Units total) by mouth every 7 (seven) days. 5 capsule 5   fludrocortisone  (FLORINEF ) 0.1 MG tablet Take 0.1 mg by mouth daily.     hydrocortisone  (CORTEF ) 10 MG tablet Take 5-15 mg by  mouth See admin instructions. Take 15 mg by mouth in the morning between 0600-0800 and 5 mg at 1400     ibuprofen (ADVIL) 800 MG tablet Take 800 mg by mouth every 8 (eight) hours as needed.     levothyroxine  (SYNTHROID ) 137 MCG tablet Take 137 mcg by mouth daily before breakfast.     Multiple Vitamins-Minerals (EMERGEN-C IMMUNE PO) Take 1 Package by mouth daily.     NON FORMULARY Pure: Thyorid Support     Ozanimod HCl (ZEPOSIA ) 0.92 MG CAPS Take 1 capsule (0.92 mg total) by mouth every morning. 30 capsule 5   acetaminophen  (TYLENOL ) 325 MG tablet Take 650 mg by mouth every 6 (six) hours as needed for moderate pain.     azithromycin  (ZITHROMAX  Z-PAK) 250 MG tablet 2 DAILY FOR  FIRST DAY, THEN DECREASE TO ONE DAILY FOR 4 MORE DAYS. 6 each 0   Ferrous Sulfate (IRON PO) Take 1 tablet by mouth daily.     No facility-administered medications prior to visit.    Allergies[1]  Review of Systems     Objective:        09/04/2024   11:51 AM 07/10/2024   10:51 AM 06/03/2024    1:26 PM  Vitals with BMI  Height 5' 4 5' 4 5' 4  Weight 143 lbs 5 oz 145 lbs 141 lbs  BMI 24.59 24.88 24.19  Systolic 118 110 887  Diastolic 64 80 79  Pulse 81 84 75    No data found.   Physical Exam  Health Maintenance Due  Topic Date Due   HIV Screening  Never done   Hepatitis C Screening  Never done    There are no preventive care reminders to display for this patient.   No results found for: TSH Lab Results  Component Value Date   WBC 5.5 09/04/2024   HGB 14.0 09/04/2024   HCT 40.5 09/04/2024   MCV 92 09/04/2024   PLT 253 09/04/2024   Lab Results  Component Value Date   NA 141 09/04/2024   K 4.3 09/04/2024   CO2 25 09/04/2024   GLUCOSE 98 09/04/2024   BUN 14 09/04/2024   CREATININE 1.01 (H) 09/04/2024   BILITOT 1.2 09/04/2024   ALKPHOS 61 09/04/2024   AST 26 09/04/2024   ALT 24 09/04/2024   PROT 6.2 09/04/2024   ALBUMIN 4.4 09/04/2024   CALCIUM  9.3 09/04/2024   ANIONGAP 5  11/18/2021   EGFR 73 09/04/2024   GFR 79.77 06/25/2022   No results found for: CHOL No results found for: HDL No results found for: LDLCALC No results found for: TRIG No results found for: CHOLHDL No results found for: YHAJ8R      Results for orders placed or performed in visit on 09/04/24  VITAMIN D  25 Hydroxy (Vit-D Deficiency, Fractures)   Collection Time: 09/04/24 12:47 PM  Result Value Ref Range   Vit D, 25-Hydroxy 49.9 30.0 - 100.0 ng/mL  C-reactive protein   Collection Time: 09/04/24 12:50 PM  Result Value Ref Range   CRP 3 0 - 10 mg/L  CBC with Differential   Collection Time: 09/04/24 12:50 PM  Result Value Ref Range   WBC 5.5 3.4 - 10.8 x10E3/uL   RBC 4.39 3.77 - 5.28 x10E6/uL   Hemoglobin 14.0 11.1 - 15.9 g/dL   Hematocrit 59.4 65.9 - 46.6 %   MCV 92 79 - 97 fL   MCH 31.9 26.6 - 33.0 pg   MCHC 34.6 31.5 - 35.7 g/dL   RDW 87.8 88.2 - 84.5 %   Platelets 253 150 - 450 x10E3/uL   Neutrophils 86 Not Estab. %   Lymphs 5 Not Estab. %   Monocytes 7 Not Estab. %   Eos 1 Not Estab. %   Basos 1 Not Estab. %   Neutrophils Absolute 4.8 1.4 - 7.0 x10E3/uL   Lymphocytes Absolute 0.3 (L) 0.7 - 3.1 x10E3/uL   Monocytes Absolute 0.4 0.1 - 0.9 x10E3/uL   EOS (ABSOLUTE) 0.0 0.0 - 0.4 x10E3/uL   Basophils Absolute 0.0 0.0 - 0.2 x10E3/uL   Immature Granulocytes 0 Not Estab. %   Immature Grans (Abs) 0.0 0.0 - 0.1 x10E3/uL  Comprehensive metabolic panel with GFR   Collection Time: 09/04/24 12:50 PM  Result Value Ref Range   Glucose 98 70 -  99 mg/dL   BUN 14 6 - 20 mg/dL   Creatinine, Ser 8.98 (H) 0.57 - 1.00 mg/dL   eGFR 73 >40 fO/fpw/8.26   BUN/Creatinine Ratio 14 9 - 23   Sodium 141 134 - 144 mmol/L   Potassium 4.3 3.5 - 5.2 mmol/L   Chloride 104 96 - 106 mmol/L   CO2 25 20 - 29 mmol/L   Calcium  9.3 8.7 - 10.2 mg/dL   Total Protein 6.2 6.0 - 8.5 g/dL   Albumin 4.4 3.9 - 4.9 g/dL   Globulin, Total 1.8 1.5 - 4.5 g/dL   Bilirubin Total 1.2 0.0 - 1.2 mg/dL    Alkaline Phosphatase 61 41 - 116 IU/L   AST 26 0 - 40 IU/L   ALT 24 0 - 32 IU/L  Sedimentation Rate   Collection Time: 09/04/24 12:50 PM  Result Value Ref Range   Sed Rate 2 0 - 32 mm/hr     Assessment & Plan:   Assessment & Plan Other headache syndrome 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 Provider suggests prednisone for 5 days to see if it makes the headache go away. - Administered Toradol  shot for headache relief. - Double hydrocortisone  to 20 mg over the weekend. - Ordered sed rate and C-reactive protein. - Ordered blood count and chemistry panel. 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 Provider suggests prednisone for 5 days to see if it makes the headache go away. - Administered Toradol  shot for headache relief. - Double hydrocortisone  to 20 mg over the weekend. - Ordered sed rate and C-reactive protein. - Ordered blood count and chemistry panel.    Addison's disease (HCC) Managed with fludrocortisone  and hydrocortisone . No signs of adrenal crisis. Discussed hydrocortisone  adjustment for stress. - Continue fludrocortisone  and hydrocortisone . - Double hydrocortisone  to 20 mg over the weekend if experiencing illness-related stress.    Vitamin D  deficiency disease Previously managed with prescription vitamin D . Resumed after lapse in adherence. - Continue prescription vitamin D  50,000 units. Orders:   VITAMIN D  25 Hydroxy (Vit-D Deficiency, Fractures)  Hypothyroidism, postop Managed with levothyroxine . - Continue levothyroxine .    Absence of both cervix and uterus, acquired       Body mass index is 24.6 kg/m..   Meds ordered this encounter  Medications   ketorolac  (TORADOL ) injection 60 mg     Orders Placed This Encounter  Procedures   C-reactive protein   CBC with Differential   Comprehensive metabolic panel with GFR   Sedimentation Rate   VITAMIN D  25 Hydroxy (Vit-D Deficiency, Fractures)     I,Marla I Leal-Borjas,acting as a scribe for Abigail Free, MD.,have documented all relevant documentation on the behalf of Abigail Free, MD,as directed by  Abigail Free, MD while in the presence of Abigail Free, MD.   Follow-up: No follow-ups on file.  An After Visit Summary was printed and given to the patient.  Abigail Free, MD [facility] [phone]     [1]  Allergies Allergen Reactions   Diroximel Fumarate Rash    Other reaction(s): rash   Methimazole Rash    Other reaction(s): rash   Penicillins Rash   "

## 2024-09-04 NOTE — Patient Instructions (Signed)
" °  VISIT SUMMARY: Mary Collins, a 40 year old female with multiple sclerosis, presented with persistent headaches and visual disturbances. The doctor addressed her headaches, Addison's disease, post-thyroidectomy hypothyroidism, and vitamin D  deficiency.  YOUR PLAN: HEADACHE POSSIBLY ASSOCIATED WITH MULTIPLE SCLEROSIS AND OPTIC NEURITIS:  -You received a Toradol  shot for headache relief. -Double your hydrocortisone  dose to 20 mg daily over the weekend. -Blood tests were ordered, including sed rate, C-reactive protein, blood count, and chemistry panel.  ADDISON'S DISEASE: Your Addison's disease is managed with fludrocortisone  and hydrocortisone , and there are no signs of adrenal crisis. -Continue taking fludrocortisone  and hydrocortisone . -Double your hydrocortisone  dose to 20 mg daily over the weekend if you experience illness-related stress.  POST-THYROIDECTOMY HYPOTHYROIDISM: Your hypothyroidism is managed with levothyroxine . -Continue taking levothyroxine .  VITAMIN D  DEFICIENCY: You have resumed taking prescription vitamin D  after a lapse. -Continue taking prescription vitamin D  50,000 units.                      Contains text generated by Abridge.                                 Contains text generated by Abridge.   "

## 2024-09-04 NOTE — Telephone Encounter (Signed)
 [facility]  [facility]: [phone] opt. 2  LVM Regarding RX for [place].

## 2024-09-05 ENCOUNTER — Encounter: Payer: Self-pay | Admitting: Family Medicine

## 2024-09-05 DIAGNOSIS — G35D Multiple sclerosis, unspecified: Secondary | ICD-10-CM | POA: Insufficient documentation

## 2024-09-05 DIAGNOSIS — G4489 Other headache syndrome: Secondary | ICD-10-CM | POA: Insufficient documentation

## 2024-09-05 DIAGNOSIS — E890 Postprocedural hypothyroidism: Secondary | ICD-10-CM | POA: Insufficient documentation

## 2024-09-05 DIAGNOSIS — E559 Vitamin D deficiency, unspecified: Secondary | ICD-10-CM | POA: Insufficient documentation

## 2024-09-05 DIAGNOSIS — Z90710 Acquired absence of both cervix and uterus: Secondary | ICD-10-CM | POA: Insufficient documentation

## 2024-09-05 DIAGNOSIS — E271 Primary adrenocortical insufficiency: Secondary | ICD-10-CM | POA: Insufficient documentation

## 2024-09-05 LAB — VITAMIN D 25 HYDROXY (VIT D DEFICIENCY, FRACTURES): Vit D, 25-Hydroxy: 49.9 ng/mL (ref 30.0–100.0)

## 2024-09-05 NOTE — Assessment & Plan Note (Signed)
 Managed with levothyroxine . - Continue levothyroxine .

## 2024-09-05 NOTE — Assessment & Plan Note (Signed)
 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 Provider suggests prednisone for 5 days to see if it makes the headache go away. - Administered Toradol  shot for headache relief. - Double hydrocortisone  to 20 mg over the weekend. - Ordered sed rate and C-reactive protein. - Ordered blood count and chemistry panel.

## 2024-09-05 NOTE — Assessment & Plan Note (Signed)
 Managed with fludrocortisone  and hydrocortisone . No signs of adrenal crisis. Discussed hydrocortisone  adjustment for stress. - Continue fludrocortisone  and hydrocortisone . - Double hydrocortisone  to 20 mg over the weekend if experiencing illness-related stress.

## 2024-09-05 NOTE — Assessment & Plan Note (Signed)
 Previously managed with prescription vitamin D . Resumed after lapse in adherence. - Continue prescription vitamin D  50,000 units. Orders:   VITAMIN D  25 Hydroxy (Vit-D Deficiency, Fractures)

## 2024-09-05 NOTE — Assessment & Plan Note (Signed)
 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 Provider suggests prednisone for 5 days to see if it makes the headache go away. - Administered Toradol  shot for headache relief. - Double hydrocortisone  to 20 mg over the weekend. - Ordered sed rate and C-reactive protein. - Ordered blood count and chemistry panel. Orders:   C-reactive protein   CBC with Differential   Comprehensive metabolic panel with GFR   Sedimentation Rate   ketorolac  (TORADOL ) injection 60 mg

## 2024-09-05 NOTE — Assessment & Plan Note (Addendum)
 SABRA

## 2024-09-07 ENCOUNTER — Other Ambulatory Visit (HOSPITAL_COMMUNITY): Payer: Self-pay

## 2024-09-07 NOTE — Telephone Encounter (Signed)
 Advised on 09/04/24 RX went to [facility]   Per Rep RX was sent to them.   Advised not by the Provider.   Telephone call to patient this morning, 09/07/24 she can not use [facility]. [facility] has to be used.    PA status

## 2024-09-07 NOTE — Telephone Encounter (Signed)
" ° °  Pharmacy Patient Advocate Encounter   Received notification from Pt Calls Messages that prior authorization for ZEPOSIA  0.92 is required/requested.   Insurance verification completed.   The patient is insured through [employer].   Per test claim: PA required; PA submitted to above mentioned insurance via Fax Key/confirmation #/EOC [id number] Status is pending  "

## 2024-09-08 ENCOUNTER — Encounter: Payer: Self-pay | Admitting: Neurology

## 2024-09-08 ENCOUNTER — Encounter: Payer: Self-pay | Admitting: Family Medicine

## 2024-09-08 NOTE — Telephone Encounter (Addendum)
 Spoke to Rep for Zeposia , Patient can use the Titration samples given.  Patient will take  Day 1: Take (4) .23 MG Cap at Once Day 2: Take (4) .23 MG Cap This should leave you with one .46 MG tabs   Day 3: Repeat Day 1 Directions (Take (4) .23 MG Cap) Day 4: Repeat Day 2 directions (Take (4) .23 MG Cap)  Day 5: You will take the last .46 MG Tabs in both Packs  Day 6: thereafter: After completing the Day 5, Begin taking the 0.92 MG cap once daily from the 2 bottles of 21 count.    Patient advised of samples. Will stop by Friday afternoon to pick up.

## 2024-09-09 NOTE — Telephone Encounter (Signed)
 [facility] lvm following up, regarding Rx. They need New Rx, due to receiving as a transfer.  PH: [phone] opt, 1. 2, or 3

## 2024-09-10 ENCOUNTER — Encounter: Payer: Self-pay | Admitting: Neurology

## 2024-09-10 MED ORDER — ZEPOSIA 0.92 MG PO CAPS: 1.0000 | ORAL_CAPSULE | Freq: Every morning | ORAL | 5 refills | Status: AC

## 2024-09-10 NOTE — Telephone Encounter (Signed)
 Per patient okay to send Zeposia  script [facility].   PER patient the back of her insurance cards states [facility] as her pharmacy but she received a call from [facility] stating they are unable to continue with the script that was transferred to them.    Advised patient she received two months worth of samples for Zeposia . Hopefully this will be straighten out before that runs out.   If not we will call the rep back.

## 2024-09-29 ENCOUNTER — Ambulatory Visit: Admitting: Neurology

## 2024-09-30 NOTE — Telephone Encounter (Addendum)
 Additional information has been requested from the patient's insurance in order to proceed with the prior authorization request. Requested information has been sent, or form has been filled out and faxed back to [phone]

## 2025-01-08 ENCOUNTER — Ambulatory Visit: Admitting: Neurology
